# Patient Record
Sex: Female | Born: 1995 | Hispanic: Yes | Marital: Single | Smoking: Never smoker
Health system: Southern US, Community
[De-identification: ages and names within clinical notes are randomized; demographics above are authoritative.]

## PROBLEM LIST (undated history)

## (undated) DIAGNOSIS — O24419 Gestational diabetes mellitus in pregnancy, unspecified control: Secondary | ICD-10-CM

## (undated) DIAGNOSIS — Z789 Other specified health status: Secondary | ICD-10-CM

## (undated) HISTORY — PX: WISDOM TOOTH EXTRACTION: SHX21

---

## 2014-08-01 DIAGNOSIS — L301 Dyshidrosis [pompholyx]: Secondary | ICD-10-CM | POA: Insufficient documentation

## 2014-08-01 DIAGNOSIS — F419 Anxiety disorder, unspecified: Secondary | ICD-10-CM | POA: Insufficient documentation

## 2019-07-29 ENCOUNTER — Other Ambulatory Visit: Payer: Self-pay

## 2019-07-29 ENCOUNTER — Inpatient Hospital Stay (HOSPITAL_COMMUNITY): Payer: BLUE CROSS/BLUE SHIELD

## 2019-07-29 ENCOUNTER — Inpatient Hospital Stay (HOSPITAL_COMMUNITY)
Admission: AD | Admit: 2019-07-29 | Discharge: 2019-07-29 | Disposition: A | Discharge status: Home / Self Care | Payer: BLUE CROSS/BLUE SHIELD | Attending: Obstetrics and Gynecology | Admitting: Obstetrics and Gynecology

## 2019-07-29 ENCOUNTER — Encounter (HOSPITAL_COMMUNITY): Payer: Self-pay | Admitting: *Deleted

## 2019-07-29 DIAGNOSIS — Z3A01 Less than 8 weeks gestation of pregnancy: Secondary | ICD-10-CM | POA: Diagnosis not present

## 2019-07-29 DIAGNOSIS — O3680X0 Pregnancy with inconclusive fetal viability, not applicable or unspecified: Secondary | ICD-10-CM

## 2019-07-29 DIAGNOSIS — O209 Hemorrhage in early pregnancy, unspecified: Secondary | ICD-10-CM | POA: Diagnosis present

## 2019-07-29 DIAGNOSIS — O021 Missed abortion: Secondary | ICD-10-CM | POA: Insufficient documentation

## 2019-07-29 HISTORY — DX: Other specified health status: Z78.9

## 2019-07-29 LAB — CBC
HCT: 37.1 % (ref 36.0–46.0)
Hemoglobin: 11.7 g/dL — ABNORMAL LOW (ref 12.0–15.0)
MCH: 28.7 pg (ref 26.0–34.0)
MCHC: 31.5 g/dL (ref 30.0–36.0)
MCV: 91.2 fL (ref 80.0–100.0)
Platelets: 320 10*3/uL (ref 150–400)
RBC: 4.07 MIL/uL (ref 3.87–5.11)
RDW: 13.2 % (ref 11.5–15.5)
WBC: 6.6 10*3/uL (ref 4.0–10.5)
nRBC: 0 % (ref 0.0–0.2)

## 2019-07-29 LAB — URINALYSIS, ROUTINE W REFLEX MICROSCOPIC
Bilirubin Urine: NEGATIVE
Glucose, UA: NEGATIVE mg/dL
Ketones, ur: NEGATIVE mg/dL
Leukocytes,Ua: NEGATIVE
Nitrite: NEGATIVE
Protein, ur: NEGATIVE mg/dL
Specific Gravity, Urine: 1.012 (ref 1.005–1.030)
pH: 7 (ref 5.0–8.0)

## 2019-07-29 LAB — WET PREP, GENITAL
Clue Cells Wet Prep HPF POC: NONE SEEN
Trich, Wet Prep: NONE SEEN
Yeast Wet Prep HPF POC: NONE SEEN

## 2019-07-29 LAB — HCG, QUANTITATIVE, PREGNANCY: hCG, Beta Chain, Quant, S: 1216 m[IU]/mL — ABNORMAL HIGH (ref <5)

## 2019-07-29 NOTE — Discharge Instructions (Signed)
Vaginal Bleeding During Pregnancy, First Trimester  A small amount of bleeding from the vagina (spotting) is relatively common during early pregnancy. It usually stops on its own. Various things may cause bleeding or spotting during early pregnancy. Some bleeding may be related to the pregnancy, and some may not. In many cases, the bleeding is normal and is not a problem. However, bleeding can also be a sign of something serious. Be sure to tell your health care provider about any vaginal bleeding right away. Some possible causes of vaginal bleeding during the first trimester include:  Infection or inflammation of the cervix.  Growths (polyps) on the cervix.  Miscarriage or threatened miscarriage.  Pregnancy tissue developing outside of the uterus (ectopic pregnancy).  A mass of tissue developing in the uterus due to an egg being fertilized incorrectly (molar pregnancy). Follow these instructions at home: Activity  Follow instructions from your health care provider about limiting your activity. Ask what activities are safe for you.  If needed, make plans for someone to help with your regular activities.  Do not have sex or orgasms until your health care provider says that this is safe. General instructions  Take over-the-counter and prescription medicines only as told by your health care provider.  Pay attention to any changes in your symptoms.  Do not use tampons or douche.  Write down how many pads you use each day, how often you change pads, and how soaked (saturated) they are.  If you pass any tissue from your vagina, save the tissue so you can show it to your health care provider.  Keep all follow-up visits as told by your health care provider. This is important. Contact a health care provider if:  You have vaginal bleeding during any part of your pregnancy.  You have cramps or labor pains.  You have a fever. Get help right away if:  You have severe cramps in your  back or abdomen.  You pass large clots or a large amount of tissue from your vagina.  Your bleeding increases.  You feel light-headed or weak, or you faint.  You have chills.  You are leaking fluid or have a gush of fluid from your vagina. Summary  A small amount of bleeding (spotting) from the vagina is relatively common during early pregnancy.  Various things may cause bleeding or spotting in early pregnancy.  Be sure to tell your health care provider about any vaginal bleeding right away. This information is not intended to replace advice given to you by your health care provider. Make sure you discuss any questions you have with your health care provider. Document Released: 09/11/2005 Document Revised: 03/23/2019 Document Reviewed: 03/06/2017 Elsevier Patient Education  2020 [place] for [employer] at [facility]       Phone: [phone]  [place] for [employer] at [place]   Phone: [place] for [employer] at [place]  Phone: [place] for [employer] at [facility]  Phone: [place] for [place] at [place]  Phone: [place] for [place] at [facility]   Phone: [facility]       Phone: [phone]  [facility]    Phone: [facility]    Phone: [phone]  [facility]    Phone: [phone]  [place]    Phone: [phone]  [facility]       Phone: [phone]   [place] [facility]-Maternity  Phone: [place]    Phone: [phone]  [facility]   Phone: [phone]  [facility]      Phone: [phone]  [facility]    Phone:  [phone]

## 2019-07-29 NOTE — MAU Note (Signed)
Yesterday, when she went to the bathroom, there was pink blood on the tissue.  Was still happening this morning.   Had sex with her boyfriend, afterwards noted a little bit more blood, still very light.  About a wk ago, had 2 +HPT.  No pain

## 2019-07-29 NOTE — MAU Provider Note (Signed)
Chief Complaint: Vaginal Bleeding and Possible Pregnancy   First Provider Initiated Contact with Patient 07/29/19 2005        SUBJECTIVE HPI: Christina [name] is a 23 y.o. G1P0 at 6454w2d by LMP who presents to maternity admissions reporting bleeding since yesterday    Started out pink yesterday and got more red today.   No cramping. She denies vaginal itching/burning, urinary symptoms, h/a, dizziness, n/v, or fever/chills.  Is sure about dates by LMP  RN note: Yesterday, when she went to the bathroom, there was pink blood on the tissue.  Was still happening this morning.   Had sex with her boyfriend, afterwards noted a little bit more blood, still very light.  About a wk ago, had 2 +HPT.  No pain    Past Medical History:  Diagnosis Date  . Medical history non-contributory    Past Surgical History:  Procedure Laterality Date  . WISDOM TOOTH EXTRACTION     Social History   Socioeconomic History  . Marital status: Single    Spouse name: Not on file  . Number of children: Not on file  . Years of education: Not on file  . Highest education level: Not on file  Occupational History  . Not on file  Social Needs  . Financial resource strain: Not on file  . Food insecurity    Worry: Not on file    Inability: Not on file  . Transportation needs    Medical: Not on file    Non-medical: Not on file  Tobacco Use  . Smoking status: Never Smoker  . Smokeless tobacco: Never Used  Substance and Sexual Activity  . Alcohol use: Not Currently  . Drug use: Not Currently    Types: Marijuana  . Sexual activity: Yes  Lifestyle  . Physical activity    Days per week: Not on file    Minutes per session: Not on file  . Stress: Not on file  Relationships  . Social Musicianconnections    Talks on phone: Not on file    Gets together: Not on file    Attends religious service: Not on file    Active member of club or organization: Not on file    Attends meetings of clubs or organizations: Not on file     Relationship status: Not on file  . Intimate partner violence    Fear of current or ex partner: Not on file    Emotionally abused: Not on file    Physically abused: Not on file    Forced sexual activity: Not on file  Other Topics Concern  . Not on file  Social History Narrative  . Not on file   No current facility-administered medications on file prior to encounter.    Current Outpatient Medications on File Prior to Encounter  Medication Sig Dispense Refill  . Prenatal Vit-Fe Fumarate-FA (MULTIVITAMIN-PRENATAL) 27-0.8 MG TABS tablet Take 1 tablet by mouth daily at 12 noon.     Allergies  Allergen Reactions  . Pollen Extract   . Latex Rash    I have reviewed patient's Past Medical Hx, Surgical Hx, Family Hx, Social Hx, medications and allergies.   ROS:  Review of Systems  Constitutional: Negative for chills and fever.  Gastrointestinal: Negative for abdominal pain, constipation, diarrhea, nausea and vomiting.  Genitourinary: Positive for vaginal bleeding. Negative for pelvic pain.  Musculoskeletal: Negative for back pain.   Review of Systems  Other systems negative   Physical Exam  Physical Exam Patient Vitals  for the past 24 hrs:  BP Temp Temp src Pulse Resp SpO2 Height Weight  07/29/19 1854 136/75 98.9 F (37.2 C) Oral 94 18 100 % 5\' 2"  (1.575 m) 79.7 kg   Constitutional: Well-developed, well-nourished female in no acute distress.  Cardiovascular: normal rate Respiratory: normal effort GI: Abd soft, non-tender. Pos BS x 4 MS: Extremities nontender, no edema, normal ROM Neurologic: Alert and oriented x 4.  GU: Neg CVAT.  PELVIC EXAM: Cervix pink, visually closed, without lesion, small bloody discharge, vaginal walls and external genitalia normal Bimanual exam: Cervix 0/long/high, firm, anterior, neg CMT, uterus nontender, nonenlarged, adnexa without tenderness, enlargement, or mass   LAB RESULTS Results for orders placed or performed during the hospital  encounter of 07/29/19 (from the past 24 hour(s))  Urinalysis, Routine w reflex microscopic     Status: Abnormal   Collection Time: 07/29/19  7:34 PM  Result Value Ref Range   Color, Urine YELLOW YELLOW   APPearance CLEAR CLEAR   Specific Gravity, Urine 1.012 1.005 - 1.030   pH 7.0 5.0 - 8.0   Glucose, UA NEGATIVE NEGATIVE mg/dL   Hgb urine dipstick LARGE (A) NEGATIVE   Bilirubin Urine NEGATIVE NEGATIVE   Ketones, ur NEGATIVE NEGATIVE mg/dL   Protein, ur NEGATIVE NEGATIVE mg/dL   Nitrite NEGATIVE NEGATIVE   Leukocytes,Ua NEGATIVE NEGATIVE   RBC / HPF 0-5 0 - 5 RBC/hpf   WBC, UA 0-5 0 - 5 WBC/hpf   Bacteria, UA RARE (A) NONE SEEN   Squamous Epithelial / LPF 0-5 0 - 5   Mucus PRESENT    Sperm, UA PRESENT   Wet prep, genital     Status: Abnormal   Collection Time: 07/29/19  8:20 PM   Specimen: Cervical/Vaginal swab  Result Value Ref Range   Yeast Wet Prep HPF POC NONE SEEN NONE SEEN   Trich, Wet Prep NONE SEEN NONE SEEN   Clue Cells Wet Prep HPF POC NONE SEEN NONE SEEN   WBC, Wet Prep HPF POC FEW (A) NONE SEEN   Sperm PRESENT   CBC     Status: Abnormal   Collection Time: 07/29/19  8:24 PM  Result Value Ref Range   WBC 6.6 4.0 - 10.5 K/uL   RBC 4.07 3.87 - 5.11 MIL/uL   Hemoglobin 11.7 (L) 12.0 - 15.0 g/dL   HCT 16.137.1 09.636.0 - 04.546.0 %   MCV 91.2 80.0 - 100.0 fL   MCH 28.7 26.0 - 34.0 pg   MCHC 31.5 30.0 - 36.0 g/dL   RDW 40.913.2 81.111.5 - 91.415.5 %   Platelets 320 150 - 400 K/uL   nRBC 0.0 0.0 - 0.2 %  hCG, quantitative, pregnancy     Status: Abnormal   Collection Time: 07/29/19  8:24 PM  Result Value Ref Range   hCG, Beta Chain, Quant, S 1,216 (H) <5 mIU/mL   Blood type  O+  IMAGING [place] Ob Comp Less 14 Wks  Result Date: 07/29/2019 CLINICAL DATA:  Spotting, positive home pregnancy test EXAM: OBSTETRIC <14 WK US AND TRANSVAGINAL OB US TECHNIQUE: Both transabdominal and transvaginal ultrasound examinations were performed for complete evaluation of the gestation as well as the maternal  uterus, adnexal regions, and pelvic cul-de-sac. Transvaginal technique was performed to assess early pregnancy. COMPARISON:  None. FINDINGS: Intrauterine gestational sac: Single Yolk sac:  Visualized. Embryo:  Visualized. Cardiac Activity: Not Visualized. CRL: 2 mm   5 w   5 d  [place] EDC: 03/25/2020 Subchorionic hemorrhage:  None visualized. Maternal uterus/adnexae: Ovaries are within normal limits. Left ovary measures 2.6 x 2.3 by 2.5 cm. Right ovary measures 3.4 x 1.7 by 2.2 cm. No significant free fluid. IMPRESSION: Single IUP with visible embryo but no cardiac activity identified. Findings are suspicious but not yet definitive for failed pregnancy. Recommend follow-up US in 10-14 days for definitive diagnosis. This recommendation follows SRU consensus guidelines: Diagnostic Criteria for Nonviable Pregnancy Early in the First Trimester. [place] [employer] Med 2013; 829:5621-30. Electronically Signed   By: Donavan Foil M.D.   On: 07/29/2019 21:19   US Ob Transvaginal  Result Date: 07/29/2019 CLINICAL DATA:  Spotting, positive home pregnancy test EXAM: OBSTETRIC <14 WK [place] AND TRANSVAGINAL OB US TECHNIQUE: Both transabdominal and transvaginal ultrasound examinations were performed for complete evaluation of the gestation as well as the maternal uterus, adnexal regions, and pelvic cul-de-sac. Transvaginal technique was performed to assess early pregnancy. COMPARISON:  None. FINDINGS: Intrauterine gestational sac: Single Yolk sac:  Visualized. Embryo:  Visualized. Cardiac Activity: Not Visualized. CRL: 2 mm   5 w   5 d                  [place] EDC: 03/25/2020 Subchorionic hemorrhage:  None visualized. Maternal uterus/adnexae: Ovaries are within normal limits. Left ovary measures 2.6 x 2.3 by 2.5 cm. Right ovary measures 3.4 x 1.7 by 2.2 cm. No significant free fluid. IMPRESSION: Single IUP with visible embryo but no cardiac activity identified. Findings are suspicious but not yet definitive for failed pregnancy.  Recommend follow-up US in 10-14 days for definitive diagnosis. This recommendation follows SRU consensus guidelines: Diagnostic Criteria for Nonviable Pregnancy Early in the First Trimester. [place] Med 2013; 865:7846-96. Electronically Signed   By: Donavan Foil M.D.   On: 07/29/2019 21:19    MAU Management/MDM: Ordered usual first trimester r/o ectopic labs.   Pelvic exam and cultures done Will check baseline Ultrasound to rule out ectopic.  This bleeding/pain can represent a normal pregnancy with bleeding, spontaneous abortion or even an ectopic which can be life-threatening.  The process as listed above helps to determine which of these is present.  Reviewed findings with patient.  Ectopic is ruled out.  We cannot ascertain viability for certain, though no cardiac activity is worrisome. Discussed it is still possible to see a HR next week.  Bleeding precautions reviewed  ASSESSMENT Single intrauterine pregnancy at [redacted]w[redacted]d by LMP, [redacted]w[redacted]d by CRL No cardiac activity Suspicious but now definitive fetal demise   PLAN Discharge home Repeat US in 1 week with followup in office SAB precautions  Pt stable at time of discharge. Encouraged to return here or to other Urgent Care/ED if she develops worsening of symptoms, increase in pain, fever, or other concerning symptoms.    Hansel Feinstein [name], MSN Certified Nurse-Midwife 07/29/2019  8:06 PM

## 2019-07-30 LAB — HIV ANTIBODY (ROUTINE TESTING W REFLEX): HIV Screen 4th Generation wRfx: NONREACTIVE

## 2019-07-31 LAB — GC/CHLAMYDIA PROBE AMP (~~LOC~~) NOT AT ARMC
Chlamydia: NEGATIVE
Neisseria Gonorrhea: NEGATIVE

## 2019-08-02 ENCOUNTER — Other Ambulatory Visit: Payer: Self-pay | Admitting: *Deleted

## 2019-08-02 DIAGNOSIS — O3680X0 Pregnancy with inconclusive fetal viability, not applicable or unspecified: Secondary | ICD-10-CM

## 2019-08-02 NOTE — Progress Notes (Signed)
Pt called office to schedule follow up US and spoke with Kara Dies. Order for [place] was not found however per chart review by me, a note from Hansel Feinstein, [name] during MAU visit on 8/13 states that pt needs follow up US one week after prior scan. She will be scheduled for [place] and will receive results in our office on the same day.

## 2019-08-03 ENCOUNTER — Ambulatory Visit (HOSPITAL_COMMUNITY): Payer: BLUE CROSS/BLUE SHIELD

## 2019-08-10 ENCOUNTER — Telehealth: Payer: Self-pay | Admitting: Family Medicine

## 2019-08-10 NOTE — Telephone Encounter (Signed)
Spoke to patient about her appointment on 8/26 @ 10:40. Patient instructed to wear a face mask for the entire appointment and no visitors are allowed with her during the visit. Patient screened for covid symptoms and denied having any

## 2019-08-11 ENCOUNTER — Other Ambulatory Visit: Payer: Self-pay

## 2019-08-11 ENCOUNTER — Ambulatory Visit (HOSPITAL_COMMUNITY)
Admission: RE | Admit: 2019-08-11 | Discharge: 2019-08-11 | Disposition: A | Discharge status: Home / Self Care | Payer: BLUE CROSS/BLUE SHIELD | Source: Ambulatory Visit | Attending: Advanced Practice Midwife | Admitting: Advanced Practice Midwife

## 2019-08-11 ENCOUNTER — Ambulatory Visit (INDEPENDENT_AMBULATORY_CARE_PROVIDER_SITE_OTHER): Payer: BLUE CROSS/BLUE SHIELD | Admitting: Lactation Services

## 2019-08-11 DIAGNOSIS — O3680X0 Pregnancy with inconclusive fetal viability, not applicable or unspecified: Secondary | ICD-10-CM | POA: Insufficient documentation

## 2019-08-11 DIAGNOSIS — O039 Complete or unspecified spontaneous abortion without complication: Secondary | ICD-10-CM

## 2019-08-11 NOTE — Progress Notes (Signed)
Called [facility] at 11:39 and was informed that Dr. Thornton Papas was reading [place] at this time.   Called back  @11 :81 and was informed that [place] and was informed images did not transmit over to be read. Dr. Thornton Papas is in dictation now.   [place] results received and was reviewed with Kerry Hough, PA. Ordered non stat Hcg with follow up in 1 week.   Pt reports she went to MAU and thought every thing was ok. She has bleeding 2 weeks ago and lasted 12 days and has stopped now.   Pt was informed that she has had a miscarriage. Pt crying with results as expected. Questions answered.

## 2019-08-12 ENCOUNTER — Telehealth: Payer: Self-pay | Admitting: *Deleted

## 2019-08-12 ENCOUNTER — Telehealth: Payer: Self-pay | Admitting: Family Medicine

## 2019-08-12 LAB — BETA HCG QUANT (REF LAB): hCG Quant: 589 m[IU]/mL

## 2019-08-12 NOTE — Telephone Encounter (Signed)
I called Christina [name] and reviewed her last bhcg and explained provider recommended follow up bhcg ( lab draw) in one week and follow up with provider in 2 weeks. I explained registrar will call her with these appointments. I also explained she will have weekly bhcg until her levels return to less than 5. She also asked if she resume trying to get pregnant. I informed her we advise no intercourse or must use condoms until her levels return to normal so that we know all products of conception have been released.We discussed there is a risk of complications.  We discussed this time may vary but usually few weeks. She voices understanding.  Rudy Luhmann,RN

## 2019-08-12 NOTE — Telephone Encounter (Signed)
-----   Message from Luvenia Redden, PA-C sent at 08/12/2019  8:49 AM EDT ----- Patient will need repeat hCG (not stat) in 1 week and follow-up with any provider in 2 weeks.   Luvenia Redden, PA-C 08/12/2019 8:49 AM

## 2019-08-12 NOTE — Telephone Encounter (Signed)
Called and spoke to patient she is aware of her upcoming appointments.

## 2019-08-17 ENCOUNTER — Other Ambulatory Visit: Payer: Self-pay | Admitting: *Deleted

## 2019-08-17 DIAGNOSIS — O039 Complete or unspecified spontaneous abortion without complication: Secondary | ICD-10-CM

## 2019-08-18 ENCOUNTER — Telehealth: Payer: Self-pay | Admitting: Family Medicine

## 2019-08-18 NOTE — Telephone Encounter (Signed)
Spoke to patient about her appointment on 9/3 @ 9:30. Patient instructed to wear a face mask for the entire appointment and no visitors are allowed with her during the visit. Patient screened for covid symptoms and denied having any

## 2019-08-19 ENCOUNTER — Other Ambulatory Visit: Payer: Self-pay

## 2019-08-19 ENCOUNTER — Other Ambulatory Visit: Payer: BLUE CROSS/BLUE SHIELD

## 2019-08-19 DIAGNOSIS — O039 Complete or unspecified spontaneous abortion without complication: Secondary | ICD-10-CM

## 2019-08-20 ENCOUNTER — Telehealth: Payer: Self-pay | Admitting: *Deleted

## 2019-08-20 LAB — BETA HCG QUANT (REF LAB): hCG Quant: 174 m[IU]/mL

## 2019-08-20 NOTE — Telephone Encounter (Signed)
I called [place] and informed her of last bhcg level and results/ reccommendations per Kerry Hough , PA. She voices understanding. I explained I will have registar to call her with lab appt for bhcg for 08/26/19.  Linda,RN

## 2019-08-20 NOTE — Telephone Encounter (Signed)
-----   Message from Luvenia Redden, PA-C sent at 08/20/2019  9:06 AM EDT ----- Please inform patient that hCG continues to fall appropriately. It is not yet < 5, so we will continue to follow. The patient can keep her appointment as scheduled on 9/14 and should come in for next hCG on 9/10 (not stat).   Luvenia Redden, PA-C  08/20/2019 9:06 AM

## 2019-08-25 ENCOUNTER — Telehealth: Payer: Self-pay | Admitting: Obstetrics & Gynecology

## 2019-08-25 NOTE — Telephone Encounter (Signed)
Called the patient to inform of the upcoming appointment, informed of wearing a face mask, no children or visitors due to covid19 restrictions. The patient also answered no to the covid19 screening questions. °

## 2019-08-26 ENCOUNTER — Other Ambulatory Visit: Payer: Self-pay

## 2019-08-26 ENCOUNTER — Other Ambulatory Visit: Payer: BLUE CROSS/BLUE SHIELD

## 2019-08-26 DIAGNOSIS — O039 Complete or unspecified spontaneous abortion without complication: Secondary | ICD-10-CM

## 2019-08-27 ENCOUNTER — Telehealth: Payer: Self-pay | Admitting: *Deleted

## 2019-08-27 LAB — BETA HCG QUANT (REF LAB): hCG Quant: 6 m[IU]/mL

## 2019-08-27 NOTE — Telephone Encounter (Signed)
-----   Message from Luvenia Redden, PA-C sent at 08/27/2019 10:38 AM EDT ----- Patient's hCG is essentially normal at this point. She can keep appointment with Dr. Nehemiah Settle as scheduled for next week.   Luvenia Redden, PA-C 08/27/2019 10:38 AM

## 2019-08-27 NOTE — Telephone Encounter (Signed)
I called [place] and left a message I am calling with your lab result but since I have not reached you we can go over that with you Monday at your appointment which is at 2:55.  Linda,RN

## 2019-08-30 ENCOUNTER — Ambulatory Visit (INDEPENDENT_AMBULATORY_CARE_PROVIDER_SITE_OTHER): Payer: Self-pay | Admitting: Obstetrics and Gynecology

## 2019-08-30 ENCOUNTER — Encounter: Payer: Self-pay | Admitting: Obstetrics and Gynecology

## 2019-08-30 ENCOUNTER — Other Ambulatory Visit: Payer: Self-pay

## 2019-08-30 DIAGNOSIS — Z8759 Personal history of other complications of pregnancy, childbirth and the puerperium: Secondary | ICD-10-CM

## 2019-08-30 DIAGNOSIS — Z8742 Personal history of other diseases of the female genital tract: Secondary | ICD-10-CM

## 2019-08-30 DIAGNOSIS — O039 Complete or unspecified spontaneous abortion without complication: Secondary | ICD-10-CM

## 2019-08-30 NOTE — Progress Notes (Signed)
Christina [name] presents for f/u after first trimester SAB She has no complaints Bleeding has stopped Last BHCG 6 Declines contraception  PE AF VSS Lungs clear Heart RRR Abd soft + BS  A/P SAB Return to normal ADL's. F/U PRN

## 2019-08-31 ENCOUNTER — Encounter: Payer: Self-pay | Admitting: Obstetrics and Gynecology

## 2020-09-08 IMAGING — US OBSTETRIC <14 WK ULTRASOUND
1 series · 15 of 28 positions shown · non-contrast
Comparison: None.

CLINICAL DATA: Spotting, positive home pregnancy test

EXAM:
OBSTETRIC <14 WK US AND TRANSVAGINAL OB US
TECHNIQUE: Both transabdominal and transvaginal ultrasound examinations were
performed for complete evaluation of the gestation as well as the
maternal uterus, adnexal regions, and pelvic cul-de-sac.
Transvaginal technique was performed to assess early pregnancy.

[Series 1: obstetric <14 wk ultrasound · 60 acquisitions, 15 frames shown]
[im 1/60]
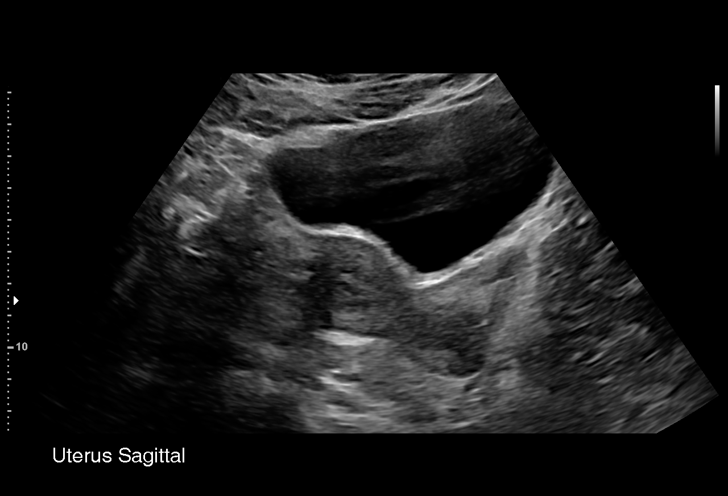
[im 5/60]
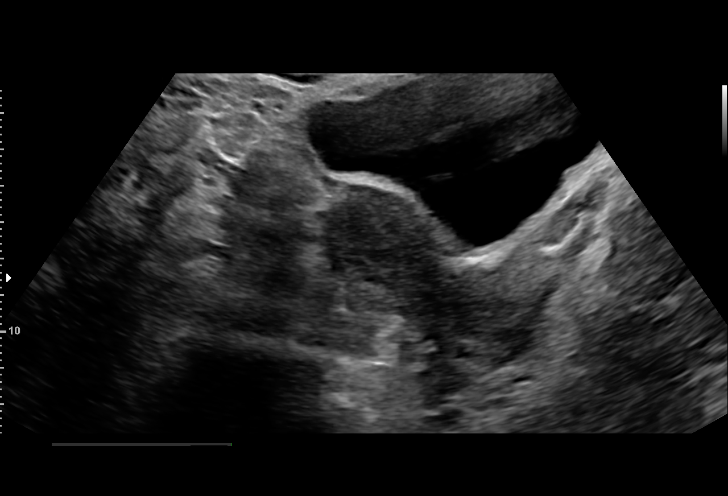
[im 9/60]
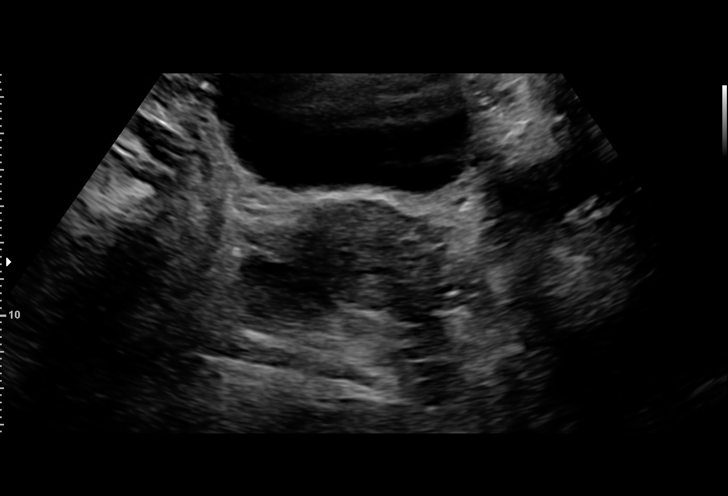
[im 14/60]
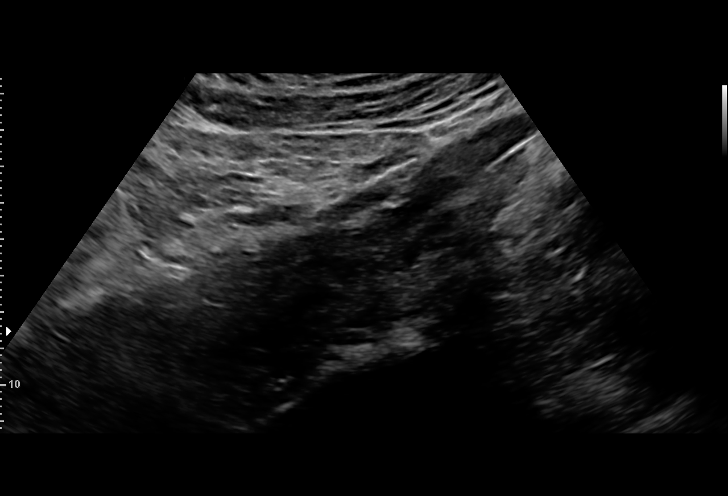
[im 18/60]
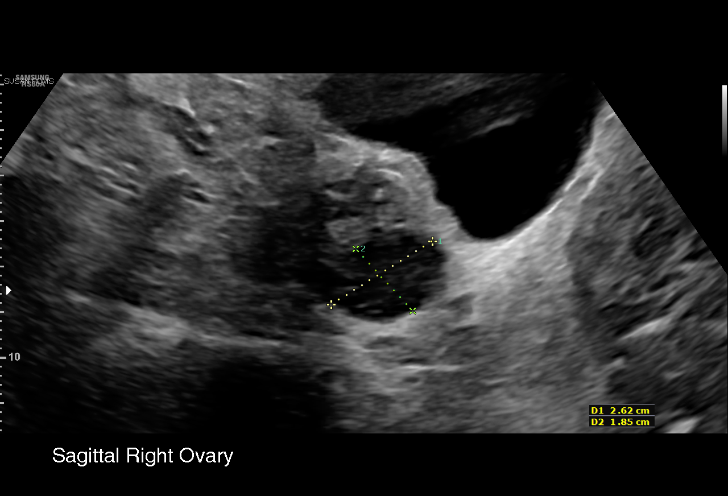
[im 22/60]
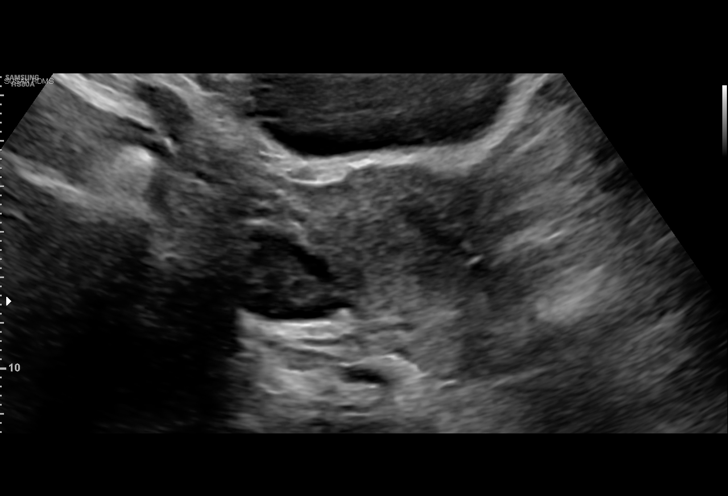
[im 27/60]
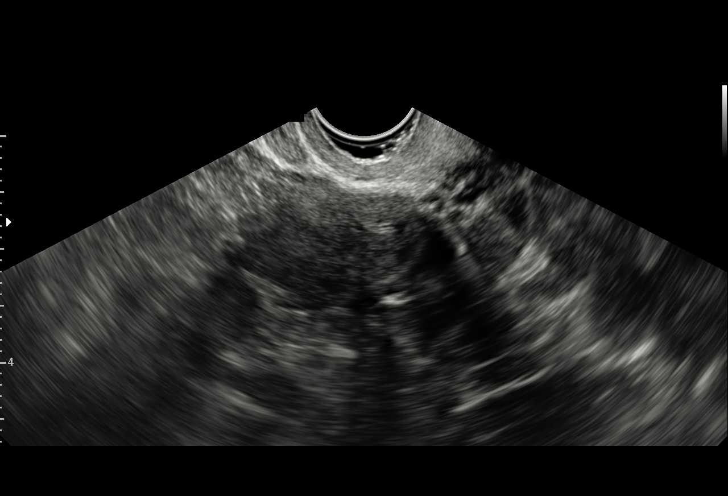
[im 31/60]
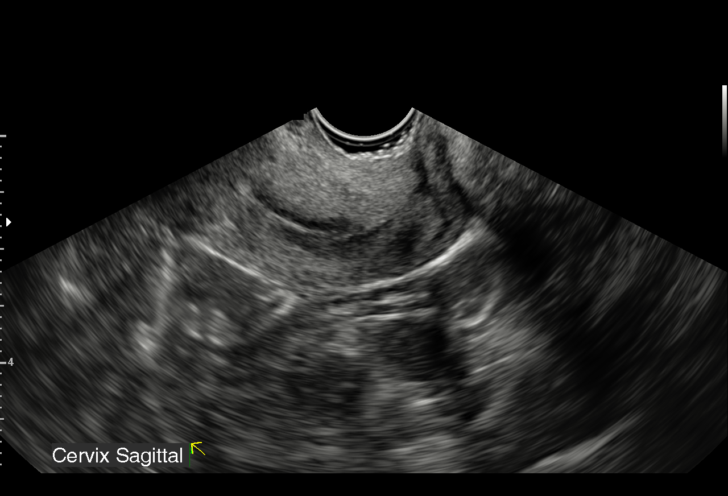
[im 33/60]
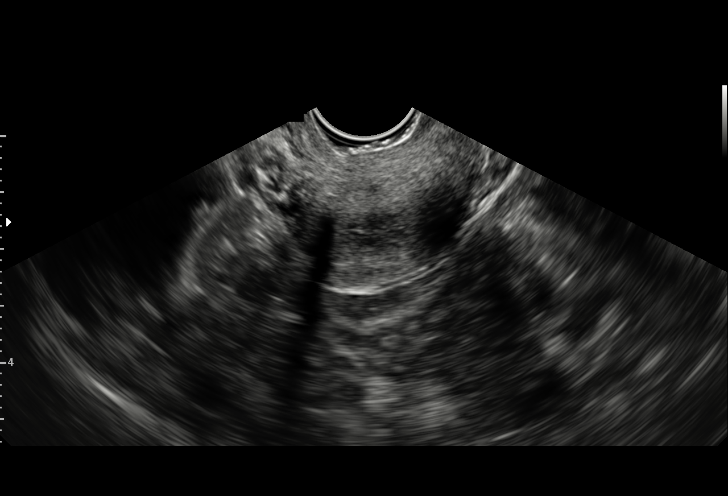
[im 38/60]
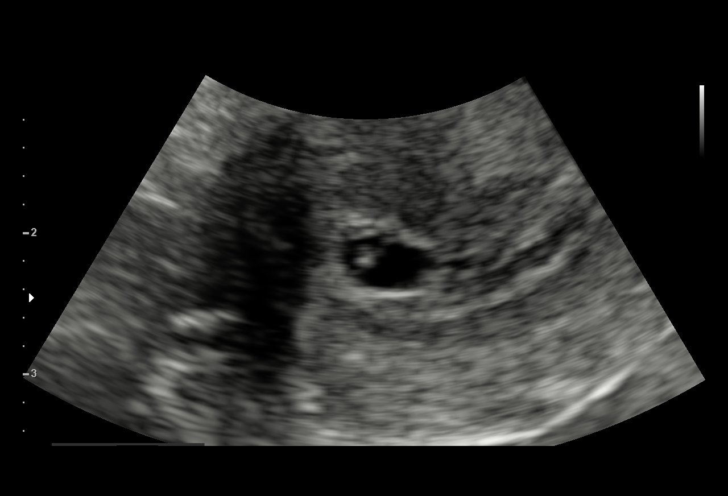
[im 42/60]
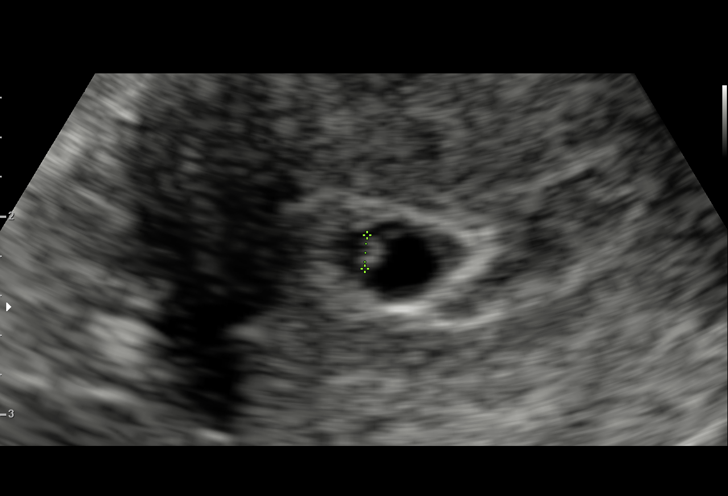
[im 46/60]
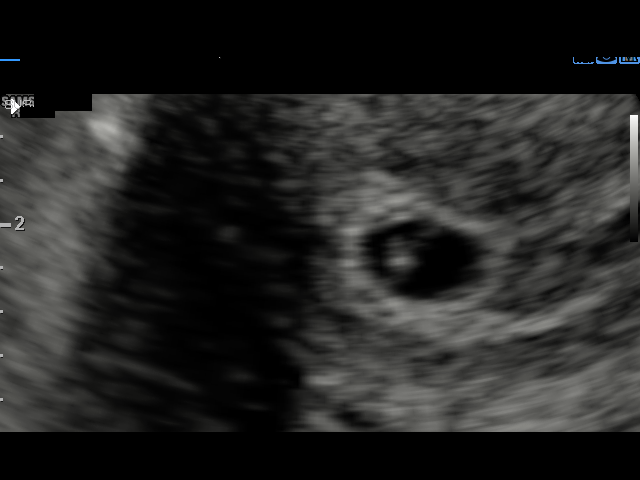
[im 51/60]
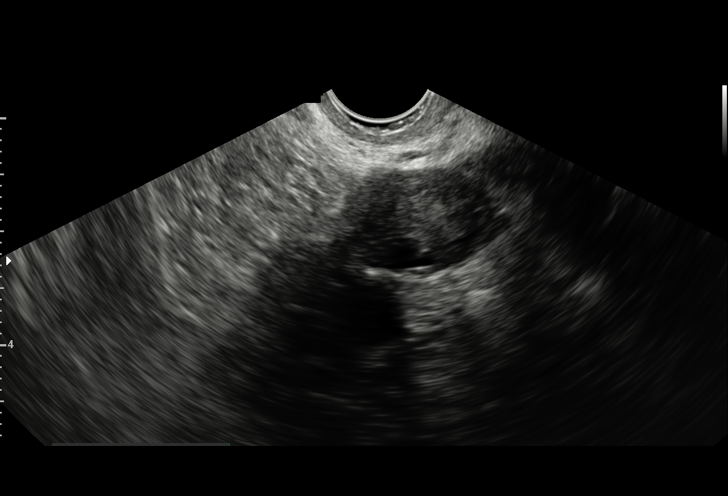
[im 55/60]
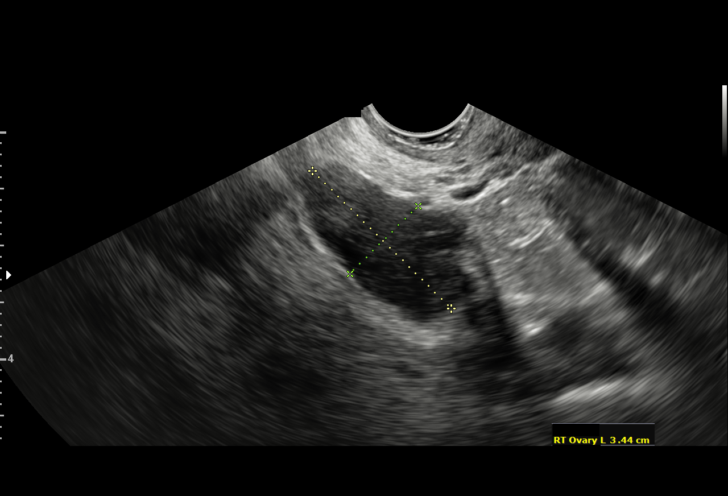
[im 60/60]
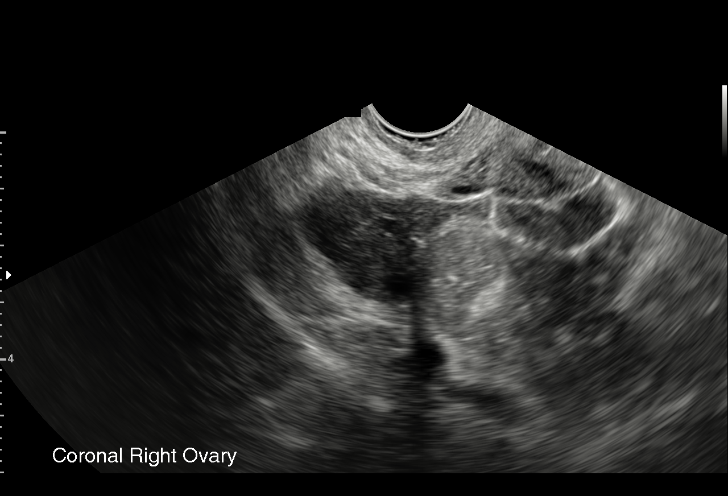

[15 of 28 positions shown; findings below may reference images not displayed]

FINDINGS: Intrauterine gestational sac: Single

Yolk sac:  Visualized.

Embryo:  Visualized.

Cardiac Activity: Not Visualized.

CRL: 2 mm   5 w   5 d                  US EDC: 03/25/2020

Subchorionic hemorrhage:  None visualized.

Maternal uterus/adnexae: Ovaries are within normal limits. Left
ovary measures 2.6 x 2.3 by 2.5 cm. Right ovary measures 3.4 x
by 2.2 cm. No significant free fluid.
IMPRESSION: Single IUP with visible embryo but no cardiac activity identified.
Findings are suspicious but not yet definitive for failed pregnancy.
Recommend follow-up US in 10-14 days for definitive diagnosis. This
recommendation follows SRU consensus guidelines: Diagnostic Criteria
for Nonviable Pregnancy Early in the First Trimester. N Engl J Med

## 2020-09-21 IMAGING — US TRANSVAGINAL OB ULTRASOUND
1 series · 15 of 28 positions shown · non-contrast
Comparison: 07/29/2019

CLINICAL DATA: Assessment of fetal viability, first trimester
pregnancy

EXAM:
TRANSVAGINAL OB ULTRASOUND
TECHNIQUE: Transvaginal ultrasound was performed for complete evaluation of the
gestation as well as the maternal uterus, adnexal regions, and
pelvic cul-de-sac.

[Series 1: transvaginal ob ultrasound · 15 of 32 slices shown]
[im 1/32]
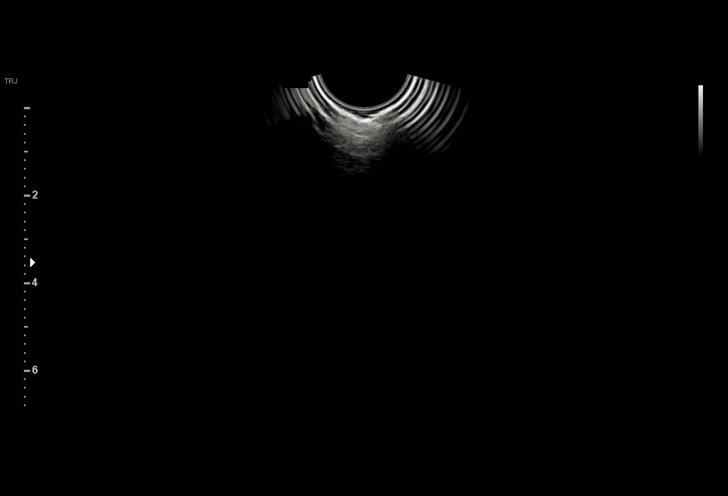
[im 3/32]
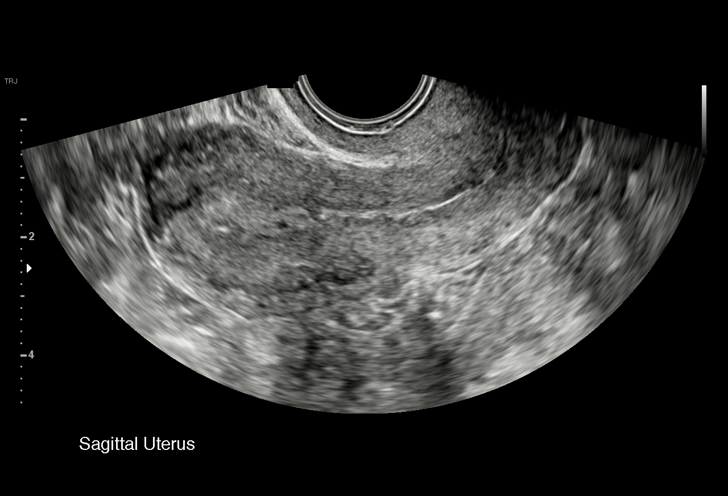
[im 5/32]
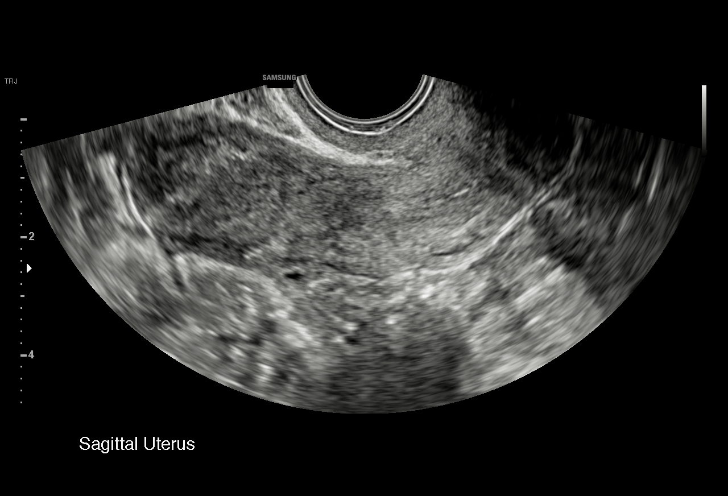
[im 7/32]
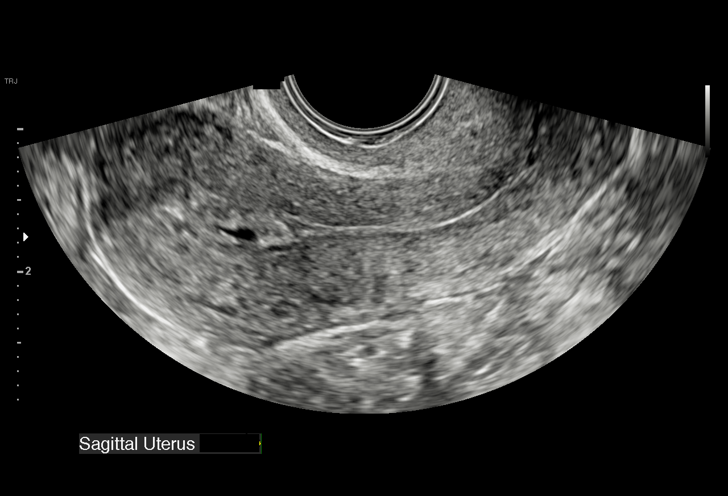
[im 10/32]
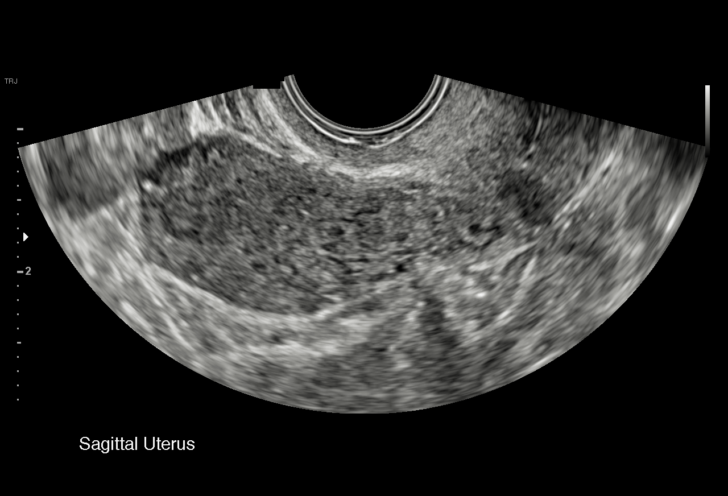
[im 12/32]
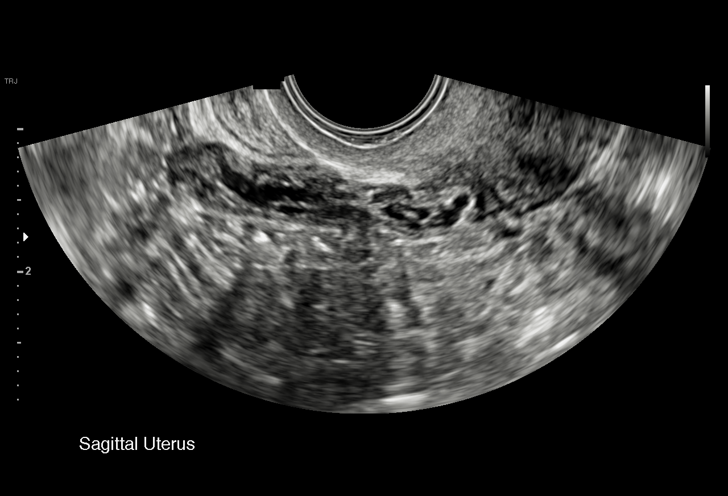
[im 14/32]
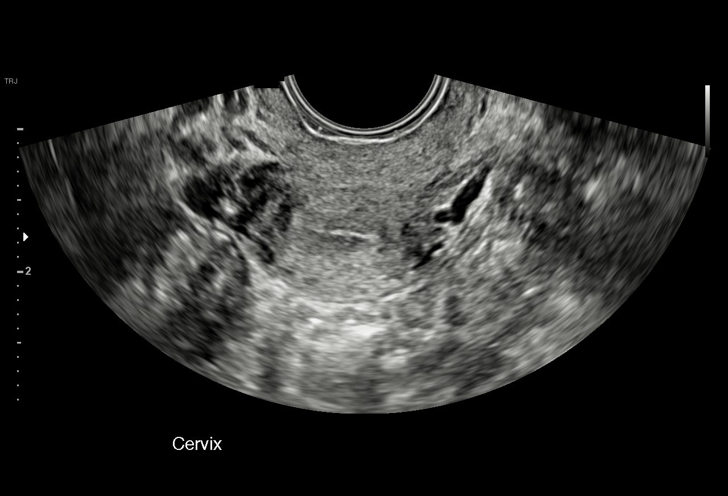
[im 17/32]
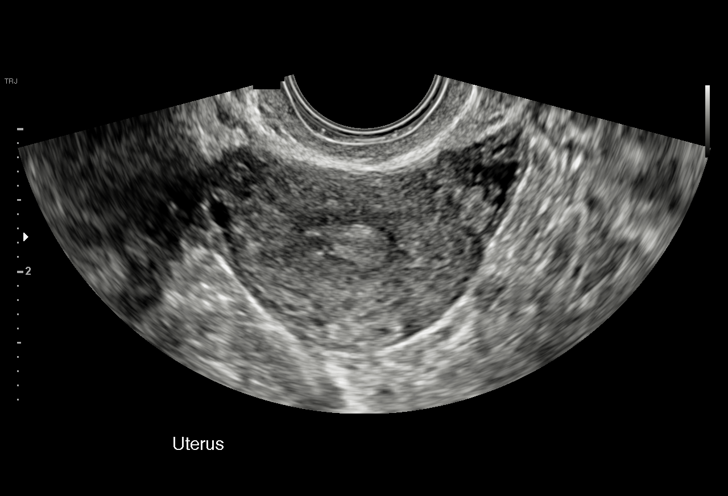
[im 18/32]
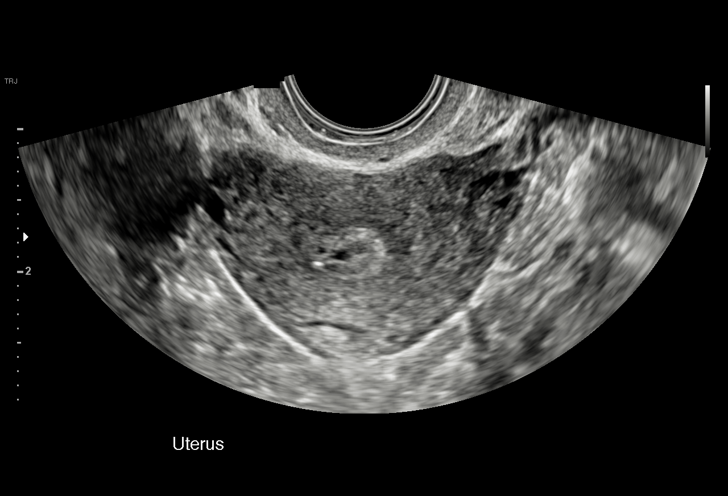
[im 20/32]
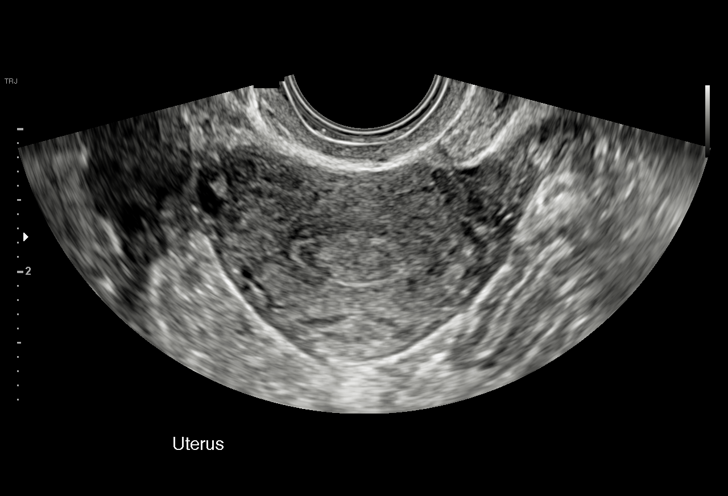
[im 22/32]
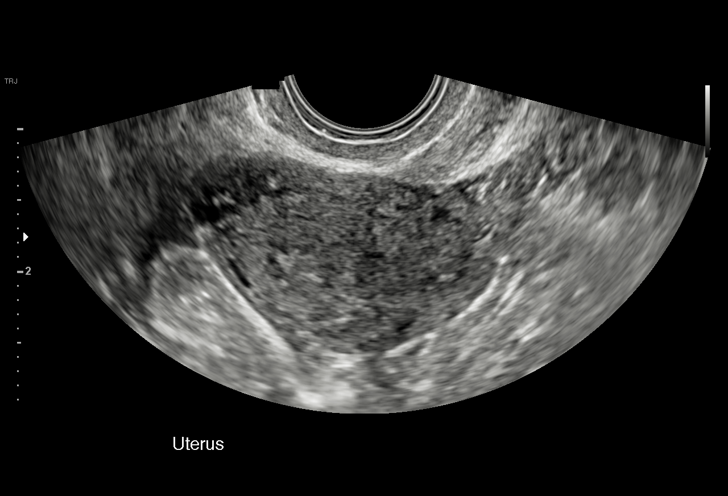
[im 25/32]
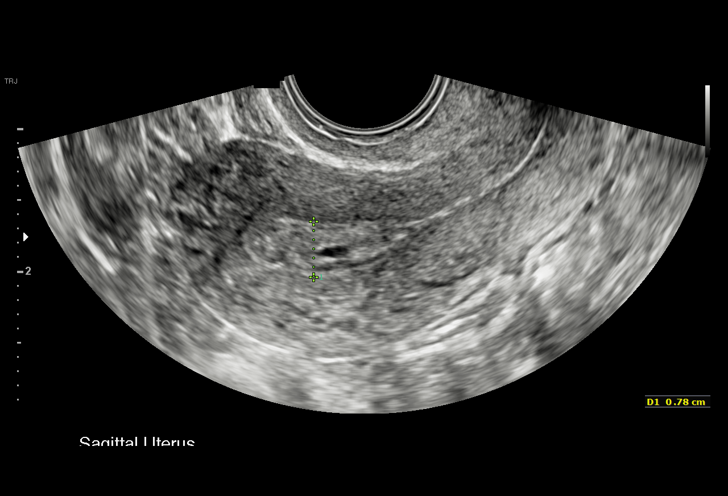
[im 27/32]
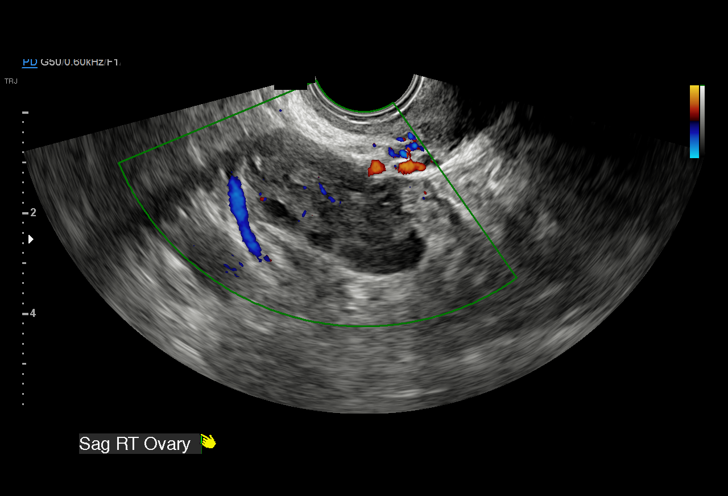
[im 29/32]
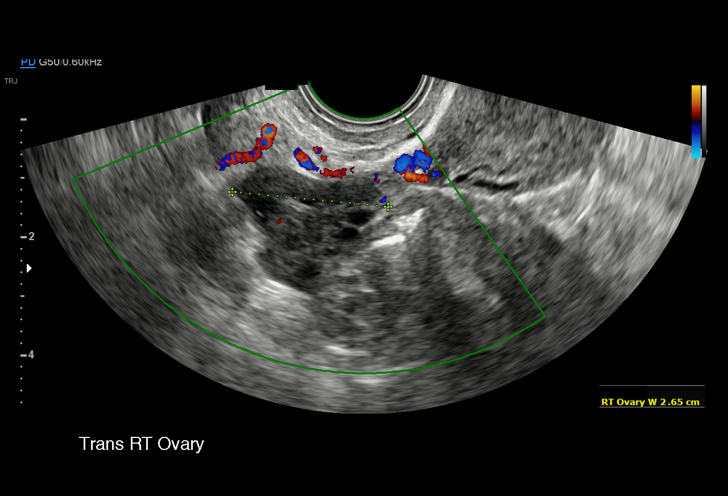
[im 32/32]
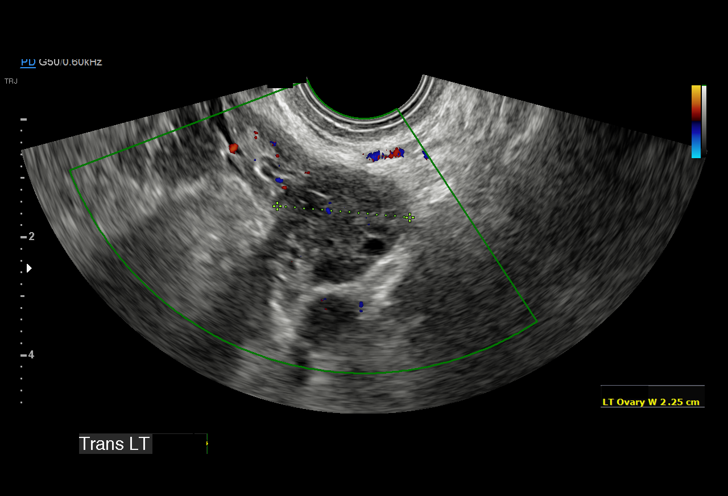

[15 of 28 positions shown; findings below may reference images not displayed]

FINDINGS: Intrauterine gestational sac: Previously identified gestational sac
no longer seen

Yolk sac:  N/A

Embryo:  N/A

Cardiac Activity: N/A

Heart Rate: N/A bpm

MSD:   mm    w     d

CRL:     mm    w  d                  US EDC:

Subchorionic hemorrhage:  None visualized.

Maternal uterus/adnexae:

Tiny endometrial fluid collection 2 x 4 x 2 mm, much smaller,
different appearance, and in a different position from the
gestational sac seen on the previous exam, favor minimal endometrial
fluid.

Endometrial complex 8 mm thick.

No additional uterine masses

RIGHT ovary normal size and morphology, 4.1 x 2.7 x 2.0 cm.

LEFT ovary normal size and morphology, 3.6 x 2.5 x 2.3 cm.

Trace free pelvic fluid.

No adnexal masses.
IMPRESSION: Previously identified gestational sac is no longer seen consistent
with spontaneous abortion.

8 mm thick endometrial complex with tiny endometrial fluid
collection 2 x 4 x 2 mm, at a different place, much smaller, and
different appearance than prior gestational sac question minimal
endometrial fluid.

Remainder of uterus and ovaries unremarkable.

## 2021-01-29 DIAGNOSIS — O26879 Cervical shortening, unspecified trimester: Secondary | ICD-10-CM | POA: Insufficient documentation

## 2021-01-29 DIAGNOSIS — N883 Incompetence of cervix uteri: Secondary | ICD-10-CM | POA: Insufficient documentation

## 2022-08-06 ENCOUNTER — Encounter: Payer: Self-pay | Admitting: General Practice

## 2022-08-06 ENCOUNTER — Inpatient Hospital Stay (HOSPITAL_COMMUNITY)
Admission: AD | Admit: 2022-08-06 | Discharge: 2022-08-06 | Disposition: A | Discharge status: Home / Self Care | Payer: Self-pay | Attending: Obstetrics and Gynecology | Admitting: Obstetrics and Gynecology

## 2022-08-06 ENCOUNTER — Ambulatory Visit (INDEPENDENT_AMBULATORY_CARE_PROVIDER_SITE_OTHER): Payer: Medicaid Other | Admitting: General Practice

## 2022-08-06 DIAGNOSIS — Z3200 Encounter for pregnancy test, result unknown: Secondary | ICD-10-CM | POA: Insufficient documentation

## 2022-08-06 DIAGNOSIS — Z3201 Encounter for pregnancy test, result positive: Secondary | ICD-10-CM

## 2022-08-06 LAB — POCT PREGNANCY, URINE: Preg Test, Ur: POSITIVE — AB

## 2022-08-06 NOTE — Progress Notes (Signed)
Patient came by office and dropped off urine sample for UPT. UPT +.  Called patient and informed her of + UPT. Patient reports first positive home test 2 weeks ago. LMP 06/23/22 EDD 03/30/23 [redacted]w[redacted]d. Allergies/meds reviewed with patient. Patient will contact [place] to initiate prenatal care.   Chase Caller RN BSN 08/06/22

## 2022-08-06 NOTE — MAU Provider Note (Signed)
Event Date/Time   First Provider Initiated Contact with Patient 08/06/22 1450      S Ms. Christina [name] is a 26 y.o. G3P1011 patient who presents to MAU today with no complaints. She desires a UPT for pregnancy confirmation. She desires to keep this pregnancy, with no intention of termination. She was referred by a family friend to come here. She denies vaginal bleeding, pelvic and vaginal pain, abnormal vaginal discharge or urinary complaints. She is experiencing some occasional nausea, but no vomiting. No other complaints.   O BP 131/78 (BP Location: Right Arm)   Pulse 88   Temp 98.4 F (36.9 C) (Oral)   Resp 16   Ht 5\' 2"  (1.575 m)   Wt 88.5 kg   LMP 06/23/2022   SpO2 100%   BMI 35.67 kg/m  Physical Exam Vitals and nursing note reviewed.  Constitutional:      General: She is not in acute distress.    Appearance: Normal appearance.  HENT:     Head: Normocephalic.  Pulmonary:     Effort: Pulmonary effort is normal.  Musculoskeletal:     Cervical back: Normal range of motion.  Skin:    General: Skin is warm and dry.  Neurological:     Mental Status: She is alert and oriented to person, place, and time.  Psychiatric:        Mood and Affect: Mood normal.     A Medical screening exam complete - Discussed with patient that the appropriate place for pregnancy testing is [facility] on [address]. Discussed the Emergency Department nature of the MAU and that standard confirmation of pregnancies is performed here. Patient verbalized understanding.    P Discharge from MAU in stable condition Provided patient the information on where to get pregnancy testing results.  List of options for follow-up given  Warning signs for worsening condition that would warrant emergency follow-up discussed Patient may return to MAU as needed   08/24/2022, Carlynn Herald 08/06/2022 5:09 PM

## 2022-08-06 NOTE — MAU Note (Signed)
Christina [name] is a 26 y.o. at Unknown here in MAU reporting: had a +preg test about 2 wks ago.  Was told she would need a preg verification.  No problems, no pain, no bleeding. Little nausea (no vomiting), a lot of fatigue.  LMP: 7/9 Onset of complaint: +HPT ~ 2 wks ago Pain score: none Vitals:   08/06/22 1443  BP: 131/78  Pulse: 88  Resp: 16  Temp: 98.4 F (36.9 C)  SpO2: 100%      Lab orders placed from triage:  none

## 2022-09-24 ENCOUNTER — Inpatient Hospital Stay (HOSPITAL_COMMUNITY)
Admission: AD | Admit: 2022-09-24 | Discharge: 2022-09-24 | Disposition: A | Discharge status: Home / Self Care | Payer: Medicaid Other | Attending: Obstetrics and Gynecology | Admitting: Obstetrics and Gynecology

## 2022-09-24 ENCOUNTER — Encounter (HOSPITAL_COMMUNITY): Payer: Self-pay | Admitting: Obstetrics and Gynecology

## 2022-09-24 DIAGNOSIS — O209 Hemorrhage in early pregnancy, unspecified: Secondary | ICD-10-CM | POA: Insufficient documentation

## 2022-09-24 DIAGNOSIS — Z3A13 13 weeks gestation of pregnancy: Secondary | ICD-10-CM | POA: Insufficient documentation

## 2022-09-24 DIAGNOSIS — O4441 Low lying placenta NOS or without hemorrhage, first trimester: Secondary | ICD-10-CM | POA: Diagnosis not present

## 2022-09-24 DIAGNOSIS — O4692 Antepartum hemorrhage, unspecified, second trimester: Secondary | ICD-10-CM | POA: Diagnosis not present

## 2022-09-24 DIAGNOSIS — O4440 Low lying placenta NOS or without hemorrhage, unspecified trimester: Secondary | ICD-10-CM

## 2022-09-24 LAB — URINALYSIS, ROUTINE W REFLEX MICROSCOPIC
Bilirubin Urine: NEGATIVE
Glucose, UA: NEGATIVE mg/dL
Ketones, ur: NEGATIVE mg/dL
Leukocytes,Ua: NEGATIVE
Nitrite: NEGATIVE
Protein, ur: 30 mg/dL — AB
Specific Gravity, Urine: 1.015 (ref 1.005–1.030)
pH: 6 (ref 5.0–8.0)

## 2022-09-24 LAB — HEPATITIS C ANTIBODY: HCV Ab: NEGATIVE

## 2022-09-24 LAB — OB RESULTS CONSOLE HEPATITIS B SURFACE ANTIGEN: Hepatitis B Surface Ag: NEGATIVE

## 2022-09-24 NOTE — MAU Note (Signed)
.  Christina [name] is a 26 y.o. at [redacted]w[redacted]d here in MAU reporting having sharp pain in R groin while at work. Went to [facility] and felt vag bleeding. Bled thru her pants. Went to doctor's office and was sent to ED. Went to [facility] ED and sat for 5hrs without being seen. Left there and came here. Gets care at [facility]. States has had an issue with bleeding. Was seen at office yesterday with spotting and ED last wk with vag bleeding. States placenta close to cerix on u/s yesterday.   Onset of complaint: today Pain score:2 Vitals:   09/24/22 2125 09/24/22 2128  BP:  137/77  Pulse: 79   Resp: 17   Temp: 98.3 F (36.8 C)   SpO2: 100%      FHT:n/a Lab orders placed from triage:  u/a

## 2022-09-24 NOTE — MAU Provider Note (Signed)
History     CSN: [id number]  Arrival date and time: 09/24/22 2107   Event Date/Time   First Provider Initiated Contact with Patient 09/24/22 2243      Chief Complaint  Patient presents with   Vaginal Bleeding   HPI  Christina [name] is a 26 y.o. G3P1011 at [redacted]w[redacted]d who presents for evaluation of vaginal bleeding. Patient reports she was at work and had a sharp pain in her groin that she describes as lightening crotch. After that, she bled through her clothes. She reports she called her OB who recommended she be seen. After sitting in the [facility] ER for 5 hours, she left and came to MAU. Labs were done in the ER which are stable. She reports the bleeding has slowed down since her arrival to MAU. She denies any pain at this time.   She reports she has a known low lying placenta with possible vessels across the cervical os.    She denies any discharge and leaking of fluid. Denies any constipation, diarrhea or any urinary complaints.   OB History     Gravida  3   Para  1   Term  1   Preterm  0   AB  1   Living  1      SAB  1   IAB  0   Ectopic  0   Multiple  0   Live Births  1           Past Medical History:  Diagnosis Date   Medical history non-contributory     Past Surgical History:  Procedure Laterality Date   CESAREAN SECTION     WISDOM TOOTH EXTRACTION      History reviewed. No pertinent family history.  Social History   Tobacco Use   Smoking status: Never   Smokeless tobacco: Never  Substance Use Topics   Alcohol use: Not Currently   Drug use: Not Currently    Types: Marijuana    Allergies:  Allergies  Allergen Reactions   Pollen Extract    Latex Rash    Medications Prior to Admission  Medication Sig Dispense Refill Last Dose   Ferrous Sulfate Dried (SLOW RELEASE IRON) 45 MG TBCR Take by mouth.   09/24/2022   ondansetron (ZOFRAN-ODT) 4 MG disintegrating tablet Take 4 mg by mouth every 8 (eight) hours as needed for nausea or  vomiting.   09/24/2022   Prenatal Vit-Fe Fumarate-FA (MULTIVITAMIN-PRENATAL) 27-0.8 MG TABS tablet Take 1 tablet by mouth daily at 12 noon.       Review of Systems  Constitutional: Negative.  Negative for fatigue and fever.  HENT: Negative.    Respiratory: Negative.  Negative for shortness of breath.   Cardiovascular: Negative.  Negative for chest pain.  Gastrointestinal: Negative.  Negative for abdominal pain, constipation, diarrhea, nausea and vomiting.  Genitourinary:  Positive for vaginal bleeding. Negative for dysuria.  Neurological: Negative.  Negative for dizziness and headaches.   Physical Exam   Blood pressure 137/77, pulse 79, temperature 98.3 F (36.8 C), resp. rate 17, height 5\' 2"  (1.575 m), weight 85.3 kg, last menstrual period 06/23/2022, SpO2 100 %, unknown if currently breastfeeding.  Patient Vitals for the past 24 hrs:  BP Temp Pulse Resp SpO2 Height Weight  09/24/22 2128 137/77 -- -- -- -- -- --  09/24/22 2125 -- 98.3 F (36.8 C) 79 17 100 % 5\' 2"  (1.575 m) 85.3 kg    Physical Exam Vitals and nursing note reviewed.  Constitutional:      General: She is not in acute distress.    Appearance: She is well-developed.  HENT:     Head: Normocephalic.  Eyes:     Pupils: Pupils are equal, round, and reactive to light.  Cardiovascular:     Rate and Rhythm: Normal rate and regular rhythm.     Heart sounds: Normal heart sounds.  Pulmonary:     Effort: Pulmonary effort is normal. No respiratory distress.     Breath sounds: Normal breath sounds.  Abdominal:     General: Bowel sounds are normal. There is no distension.     Palpations: Abdomen is soft.     Tenderness: There is no abdominal tenderness.  Genitourinary:    Comments: SSE: small amount of dark brown blood on vaginal walls Skin:    General: Skin is warm and dry.  Neurological:     Mental Status: She is alert and oriented to person, place, and time.  Psychiatric:        Mood and Affect: Mood normal.         Behavior: Behavior normal.        Thought Content: Thought content normal.        Judgment: Judgment normal.    MAU Course  Procedures  Results for orders placed or performed during the hospital encounter of 09/24/22 (from the past 24 hour(s))  Urinalysis, Routine w reflex microscopic Urine, Clean Catch     Status: Abnormal   Collection Time: 09/24/22  9:45 PM  Result Value Ref Range   Color, Urine YELLOW YELLOW   APPearance CLOUDY (A) CLEAR   Specific Gravity, Urine 1.015 1.005 - 1.030   pH 6.0 5.0 - 8.0   Glucose, UA NEGATIVE NEGATIVE mg/dL   Hgb urine dipstick LARGE (A) NEGATIVE   Bilirubin Urine NEGATIVE NEGATIVE   Ketones, ur NEGATIVE NEGATIVE mg/dL   Protein, ur 30 (A) NEGATIVE mg/dL   Nitrite NEGATIVE NEGATIVE   Leukocytes,Ua NEGATIVE NEGATIVE   RBC / HPF 11-20 0 - 5 RBC/hpf   WBC, UA 6-10 0 - 5 WBC/hpf   Bacteria, UA FEW (A) NONE SEEN   Squamous Epithelial / LPF 11-20 0 - 5   Mucus PRESENT     MDM Prenatal records from community office reviewed. Pregnancy complicated by low lying placenta, hx of c/s.  Ultrasound result from 09/23/22 in office:  "Transvaginal and limited transabdominal u/s at 13+wks, Anteverted Uterus,  single gestational sac, single viable IUP, CRL c/w dates, FHR = 161BPM,  posterior grade 0 placenta - very low lying placenta - approx. 0.34cm  superior to internal os - prominent vessels seen adjacent to internal os,  cervix long and closed, Bilateral adnexa WNL, trace free fluid in the  posterior cul de sac."  Labs ordered and reviewed.   UA Labs reviewed from [place] 12.1 O Pos blood type  No active bleeding on exam  Pt informed that the ultrasound is considered a limited OB ultrasound and is not intended to be a complete ultrasound exam.  Patient also informed that the ultrasound is not being completed with the intent of assessing for fetal or placental anomalies or any pelvic abnormalities.  Explained that the purpose of today's  ultrasound is to assess for  viability.  Patient acknowledges the purpose of the exam and the limitations of the study.    Live IUP with FHR 144 bpm and active fetal movement noted.   Lengthy discussion with patient of implications of low  lying placenta and when to be seen. Reviewed unpredictability of bleeding and frank discussion of non viable fetus at this time. Discussed MFM consult and hope that placenta moves as uterus grows. Patient states she plans to transfer care to [facility] going forward.  Assessment and Plan   1. Vaginal bleeding in pregnancy, second trimester   2. [redacted] weeks gestation of pregnancy   3. Low-lying placenta     -Discharge home in stable condition -Vaginal bleeding precautions discussed -Patient advised to follow-up with OB as scheduled for prenatal care -Patient may return to MAU as needed or if her condition were to change or worsen  Wende Mott, [name] 09/24/2022, 10:43 PM

## 2022-09-24 NOTE — Discharge Instructions (Signed)

## 2022-10-29 ENCOUNTER — Encounter: Payer: Self-pay | Admitting: General Practice

## 2022-12-16 NOTE — L&D Delivery Note (Signed)
   Delivery Note:   G3P1011 at [redacted]w[redacted]d  Admitting diagnosis: Indication for care in labor or delivery [O75.9] Risks: TOLAC  A1GDM  Anxiety   First Stage:  Induction of labor:N/A  Onset of labor: 0100 03/24/23 Augmentation: Pitocin that was discontinued.  ROM: SROM mec stained fluid @ 0100 Active labor onset: @ 0100 Analgesia /Anesthesia/Pain control intrapartum: Epidural   Second Stage:  Complete dilation at 03/24/2023  1145 Onset of pushing at 11:48 FHR second stage Cat II. Variables with contractions, with quick return to baseline. Moderate variability, accels present.    [name] called to patient bedside. Patient Pushing in lithotomy position with [name] and L&D staff support at bedside. Patient FOB and Doula present for delivery and supportive.    Delivery of a Live born female  Birth Weight:  PENDING  APGAR: 7, 9  Newborn Delivery   Birth date/time: 03/24/2023 13:41:00 Delivery type: Vaginal, Spontaneous     With great maternal pushing efforts, fetal head crowned to +4 station. NICU team called to bedside for thick mec stained fluid. Turtle sign observed, MD called to bedside for standby. NICU at bedside.  On the next contractions Fetal head delivered in cephalic presentation, position DOA 1 loop of umbilical cord expelled with head. On the next push fetal head  spontaneously restituted to LOT and remaining fetal body delivered with ease. Crying infant placed skin to skin. NICU dismissed.  Nuchal Cord: No    After 4 mins of life cord double clamped after cessation of pulsation, and cut by FOB.  Collection of cord blood for typing completed. Cord blood donation-None  Arterial cord blood sample-No    Third Stage:  With gentle cord traction and maternal pushing efforts Placenta delivered-Spontaneous  intact with 3 vessels . Uterine tone firm bleeding minimal  Uterotonics: IV Bolus Initiated  Placenta to patient for disposal.  1st degree  laceration identified.  Episiotomy: N/A   Local analgesia: N/A   Repair: 1st degree perineal repaired to hemostasis in the traditional fashion  Est. Blood Loss (mL):300.00   Complications: None   Mom to postpartum.  Baby boy "Elijah" to Couplet care / Skin to Skin.  Delivery Report:  Review the Delivery Report for details.    Ameerah Huffstetler Danella Deis) Suzie Portela, MSN, [name]  [facility]  03/24/23  2:34 PM

## 2022-12-24 ENCOUNTER — Encounter: Payer: Self-pay | Admitting: General Practice

## 2022-12-24 ENCOUNTER — Ambulatory Visit (INDEPENDENT_AMBULATORY_CARE_PROVIDER_SITE_OTHER): Payer: Medicaid Other | Admitting: Advanced Practice Midwife

## 2022-12-24 ENCOUNTER — Encounter: Payer: Self-pay | Admitting: Advanced Practice Midwife

## 2022-12-24 VITALS — BP 112/74 | HR 99 | Wt 197.0 lb

## 2022-12-24 DIAGNOSIS — Z3A26 26 weeks gestation of pregnancy: Secondary | ICD-10-CM | POA: Diagnosis not present

## 2022-12-24 DIAGNOSIS — O99342 Other mental disorders complicating pregnancy, second trimester: Secondary | ICD-10-CM | POA: Diagnosis not present

## 2022-12-24 DIAGNOSIS — Z8759 Personal history of other complications of pregnancy, childbirth and the puerperium: Secondary | ICD-10-CM

## 2022-12-24 DIAGNOSIS — Z98891 History of uterine scar from previous surgery: Secondary | ICD-10-CM

## 2022-12-24 DIAGNOSIS — N368 Other specified disorders of urethra: Secondary | ICD-10-CM

## 2022-12-24 DIAGNOSIS — O26879 Cervical shortening, unspecified trimester: Secondary | ICD-10-CM

## 2022-12-24 DIAGNOSIS — O4440 Low lying placenta NOS or without hemorrhage, unspecified trimester: Secondary | ICD-10-CM

## 2022-12-24 DIAGNOSIS — O4442 Low lying placenta NOS or without hemorrhage, second trimester: Secondary | ICD-10-CM

## 2022-12-24 DIAGNOSIS — O26872 Cervical shortening, second trimester: Secondary | ICD-10-CM | POA: Diagnosis not present

## 2022-12-24 DIAGNOSIS — F419 Anxiety disorder, unspecified: Secondary | ICD-10-CM

## 2022-12-24 DIAGNOSIS — Z3480 Encounter for supervision of other normal pregnancy, unspecified trimester: Secondary | ICD-10-CM | POA: Insufficient documentation

## 2022-12-24 NOTE — Progress Notes (Signed)
INITIAL OBSTETRICAL VISIT Patient name: Christina [name] MRN [id number]  Date of birth: 22-Sep-1996 Chief Complaint:   No chief complaint on file.  History of Present Illness:   Christina [name] is a 27 y.o. G43P1011 Hispanic female at [redacted]w[redacted]d by [place] at 48 weeks with an Estimated Date of Delivery: 03/30/23 being seen today for her initial obstetrical visit.   Her obstetrical history is significant for intrauterine growth restriction (IUGR) and Skenes duct cyst, short cervix, anxiety, lowlying placenta (now resolved), Prior cesarean delivery (for NRFHR) .   Today she reports no complaints.     12/24/2022   11:40 AM 08/31/2019    8:30 AM  Depression screen PHQ 2/9  Decreased Interest 3 2  Down, Depressed, Hopeless 1 3  PHQ - 2 Score 4 5  Altered sleeping 0 2  Tired, decreased energy 3 2  Change in appetite 0 0  Feeling bad or failure about yourself  0 1  Trouble concentrating 1 0  Moving slowly or fidgety/restless 0 0  Suicidal thoughts 0 0  PHQ-9 Score 8 10    Patient's last menstrual period was 06/23/2022. Last pap 07/2022. Results were: normal Review of Systems:   Pertinent items are noted in HPI Denies cramping/contractions, leakage of fluid, vaginal bleeding, abnormal vaginal discharge w/ itching/odor/irritation, headaches, visual changes, shortness of breath, chest pain, abdominal pain, severe nausea/vomiting, or problems with urination or bowel movements unless otherwise stated above.  Pertinent History Reviewed:  Reviewed past medical,surgical, social, obstetrical and family history.  Reviewed problem list, medications and allergies. OB History  Gravida Para Term Preterm AB Living  3 1 1  0 1 1  SAB IAB Ectopic Multiple Live Births  1 0 0 0 1    # Outcome Date GA Lbr Len/2nd Weight Sex Delivery Anes PTL Lv  3 Current           2 Term 02/24/21    F CS-Unspec   LIV  1 SAB            Physical Assessment:   Vitals:   12/24/22 0917  BP: 112/74  Pulse: 99  Weight: 197 lb (89.4  kg)  Body mass index is 36.03 kg/m.       Physical Examination:  General appearance - well appearing, and in no distress  Mental status - alert, oriented to person, place, and time  Psych:  She has a normal mood and affect  Skin - warm and dry, normal color, no suspicious lesions noted  Chest - effort normal, all lung fields clear to auscultation bilaterally  Heart - normal rate and regular rhythm  Abdomen - soft, nontender  Extremities:  No swelling or varicosities noted  Pelvic - VULVA: normal appearing vulva with no masses, tenderness or lesions  VAGINA: normal appearing vagina with normal color and discharge, no lesions  CERVIX: normal appearing cervix without discharge or lesions, no CMT  Thin prep pap is not done   No results found for this or any previous visit (from the past 24 hour(s)).  Assessment & Plan:  1) High-Risk Pregnancy G3P1011 at [redacted]w[redacted]d with an Estimated Date of Delivery: 03/30/23  Plans to breastfeed only colostrum then bottle feeding  2) Initial OB visit  3) Prior Cesarean Delivery, plans TOLAC ([facility])  4)   Skenes duct cyst (prefers surgery postpartu)  Referral to UroGyne to discuss PP surgery options  5)  Anxiety Referred to [facility] for eval/tx  6)  Short Cervix  Taking Prometrium, check  CL with [place]  7)  Low lying placent, resolved (now posterior)  Meds: Prometrium, Iron, zofran, PNV  Initial labs obtained Continue prenatal vitamins Reviewed n/v relief measures and warning s/s to report Reviewed recommended weight gain based on pre-gravid BMI Encouraged well-balanced diet Genetic & carrier screening discussed: normal  Ultrasound discussed; fetal survey: ordered  Will check cervical length CCNC completed> form faxed if has or is planning to apply for medicaid The nature of [facility] [employer] with multiple MDs and other Advanced Practice Providers was explained to patient; also emphasized that fellows, residents,  and students are part of our team.    Indications for ASA therapy (per uptodate) Discussed by other doctor at [facility], recommended but ultimately not ordered  Glucola this week (not  fasting today)  Follow-up: 2 weeks   Orders Placed This Encounter  Procedures   [place] MFM OB FOLLOW UP   US MFM OB DETAIL +14 WK   [place] MFM OB Transvaginal   Ambulatory referral to Integrated Behavioral Health   Ambulatory referral to Urogynecology    Wynelle Bourgeois [name], [facility] 12/24/2022 12:18 PM

## 2023-01-07 ENCOUNTER — Ambulatory Visit: Payer: Medicaid Other | Attending: Advanced Practice Midwife

## 2023-01-07 ENCOUNTER — Encounter: Payer: Self-pay | Admitting: Advanced Practice Midwife

## 2023-01-07 ENCOUNTER — Other Ambulatory Visit: Payer: Self-pay

## 2023-01-07 ENCOUNTER — Ambulatory Visit: Payer: Medicaid Other

## 2023-01-07 VITALS — BP 130/72 | HR 83

## 2023-01-07 DIAGNOSIS — O4443 Low lying placenta NOS or without hemorrhage, third trimester: Secondary | ICD-10-CM | POA: Diagnosis not present

## 2023-01-07 DIAGNOSIS — O4440 Low lying placenta NOS or without hemorrhage, unspecified trimester: Secondary | ICD-10-CM | POA: Insufficient documentation

## 2023-01-07 DIAGNOSIS — Z3A28 28 weeks gestation of pregnancy: Secondary | ICD-10-CM | POA: Diagnosis not present

## 2023-01-07 DIAGNOSIS — Z98891 History of uterine scar from previous surgery: Secondary | ICD-10-CM

## 2023-01-07 DIAGNOSIS — O09293 Supervision of pregnancy with other poor reproductive or obstetric history, third trimester: Secondary | ICD-10-CM | POA: Diagnosis not present

## 2023-01-07 DIAGNOSIS — O26873 Cervical shortening, third trimester: Secondary | ICD-10-CM | POA: Diagnosis not present

## 2023-01-07 DIAGNOSIS — F419 Anxiety disorder, unspecified: Secondary | ICD-10-CM | POA: Diagnosis not present

## 2023-01-07 DIAGNOSIS — O34219 Maternal care for unspecified type scar from previous cesarean delivery: Secondary | ICD-10-CM

## 2023-01-07 DIAGNOSIS — Z3480 Encounter for supervision of other normal pregnancy, unspecified trimester: Secondary | ICD-10-CM | POA: Diagnosis present

## 2023-01-07 DIAGNOSIS — Z363 Encounter for antenatal screening for malformations: Secondary | ICD-10-CM | POA: Insufficient documentation

## 2023-01-07 DIAGNOSIS — O99343 Other mental disorders complicating pregnancy, third trimester: Secondary | ICD-10-CM | POA: Diagnosis not present

## 2023-01-07 DIAGNOSIS — E669 Obesity, unspecified: Secondary | ICD-10-CM

## 2023-01-07 DIAGNOSIS — O99213 Obesity complicating pregnancy, third trimester: Secondary | ICD-10-CM

## 2023-01-07 DIAGNOSIS — Z8759 Personal history of other complications of pregnancy, childbirth and the puerperium: Secondary | ICD-10-CM

## 2023-01-08 ENCOUNTER — Other Ambulatory Visit: Payer: Medicaid Other

## 2023-01-08 ENCOUNTER — Ambulatory Visit: Payer: Medicaid Other

## 2023-01-09 ENCOUNTER — Ambulatory Visit (INDEPENDENT_AMBULATORY_CARE_PROVIDER_SITE_OTHER): Payer: Medicaid Other | Admitting: Family Medicine

## 2023-01-09 VITALS — BP 122/71 | HR 96 | Wt 198.0 lb

## 2023-01-09 DIAGNOSIS — O26873 Cervical shortening, third trimester: Secondary | ICD-10-CM

## 2023-01-09 DIAGNOSIS — Z3480 Encounter for supervision of other normal pregnancy, unspecified trimester: Secondary | ICD-10-CM

## 2023-01-09 DIAGNOSIS — N368 Other specified disorders of urethra: Secondary | ICD-10-CM

## 2023-01-09 DIAGNOSIS — Z98891 History of uterine scar from previous surgery: Secondary | ICD-10-CM

## 2023-01-09 DIAGNOSIS — Z8759 Personal history of other complications of pregnancy, childbirth and the puerperium: Secondary | ICD-10-CM

## 2023-01-09 DIAGNOSIS — Z3A28 28 weeks gestation of pregnancy: Secondary | ICD-10-CM

## 2023-01-09 NOTE — Progress Notes (Signed)
   PRENATAL VISIT NOTE  Subjective:  Christina [name] is a 27 y.o. G3P1011 at [redacted]w[redacted]d being seen today for ongoing prenatal care.  She is currently monitored for the following issues for this high-risk pregnancy and has Supervision of other normal pregnancy, antepartum; Anxiety; Eczema, dyshidrotic; Short cervical length during pregnancy; Skene's duct cyst; History of cesarean delivery; History of prior pregnancy with IUGR newborn; and SGA (small for gestational age) on their problem list.  Patient reports no complaints.  Contractions: Not present. Vag. Bleeding: None.  Movement: Present. Denies leaking of fluid.   The following portions of the patient's history were reviewed and updated as appropriate: allergies, current medications, past family history, past medical history, past social history, past surgical history and problem list.   Objective:   Vitals:   01/09/23 0854  BP: 122/71  Pulse: 96  Weight: 198 lb (89.8 kg)    Fetal Status: Fetal Heart Rate (bpm): 148 Fundal Height: 28 cm Movement: Present  Presentation: Vertex  General:  Alert, oriented and cooperative. Patient is in no acute distress.  Skin: Skin is warm and dry. No rash noted.   Cardiovascular: Normal heart rate noted  Respiratory: Normal respiratory effort, no problems with respiration noted  Abdomen: Soft, gravid, appropriate for gestational age.  Pain/Pressure: Present (occasional pain- lower abdomen)     Pelvic: Cervical exam deferred        Extremities: Normal range of motion.  Edema: Trace  Mental Status: Normal mood and affect. Normal behavior. Normal judgment and thought content.   Assessment and Plan:  Pregnancy: G3P1011 at [redacted]w[redacted]d 1. Supervision of other normal pregnancy, antepartum FHT and FH normal - Glucose Tolerance, 2 Hours w/1 Hour - RPR - HIV antibody (with reflex) - CBC  2. [redacted] weeks gestation of pregnancy - Glucose Tolerance, 2 Hours w/1 Hour - RPR - HIV antibody (with reflex) - CBC  3.  Short cervical length during pregnancy in third trimester CL 2.8cm@17  wk, 2.6cm@20  wk  2cm@23 &28 [place] 1/23 - CL 2.2cm On progesterone.  4. Skene's duct cyst Referred to urogyn.  5. History of cesarean delivery C/s for fetal bradycardia. TOLAC form signed  6. History of prior pregnancy with IUGR newborn EFW 13%  Preterm labor symptoms and general obstetric precautions including but not limited to vaginal bleeding, contractions, leaking of fluid and fetal movement were reviewed in detail with the patient. Please refer to After Visit Summary for other counseling recommendations.   No follow-ups on file.  Future Appointments  Date Time Provider [place]  01/09/2023 10:35 AM Truett Mainland, DO [facility] None  01/15/2023  2:00 PM Lynnea Ferrier, LCSW [facility] None  01/21/2023 10:55 AM Gavin Pound, [name] [facility] None  02/04/2023 10:55 AM Seabron Spates, [name] [facility] None  02/04/2023  2:30 PM [facility] NURSE [facility]  02/04/2023  2:45 PM [facility] US6 [facility]  02/18/2023 10:55 AM Seabron Spates, [name] [facility] None  03/04/2023 11:15 AM Seabron Spates, [name] [facility] None    Truett Mainland, DO

## 2023-01-10 LAB — CBC
Hematocrit: 32 % — ABNORMAL LOW (ref 34.0–46.6)
Hemoglobin: 10.1 g/dL — ABNORMAL LOW (ref 11.1–15.9)
MCH: 27.7 pg (ref 26.6–33.0)
MCHC: 31.6 g/dL (ref 31.5–35.7)
MCV: 88 fL (ref 79–97)
Platelets: 282 10*3/uL (ref 150–450)
RBC: 3.65 x10E6/uL — ABNORMAL LOW (ref 3.77–5.28)
RDW: 13.6 % (ref 11.7–15.4)
WBC: 7.3 10*3/uL (ref 3.4–10.8)

## 2023-01-10 LAB — HIV ANTIBODY (ROUTINE TESTING W REFLEX): HIV Screen 4th Generation wRfx: NONREACTIVE

## 2023-01-10 LAB — RPR: RPR Ser Ql: NONREACTIVE

## 2023-01-10 LAB — GLUCOSE TOLERANCE, 2 HOURS W/ 1HR
Glucose, 1 hour: 128 mg/dL (ref 70–179)
Glucose, 2 hour: 191 mg/dL — ABNORMAL HIGH (ref 70–152)
Glucose, Fasting: 93 mg/dL — ABNORMAL HIGH (ref 70–91)

## 2023-01-14 ENCOUNTER — Encounter: Payer: Self-pay | Admitting: Licensed Clinical Social Worker

## 2023-01-14 ENCOUNTER — Telehealth: Payer: Self-pay

## 2023-01-14 DIAGNOSIS — O24419 Gestational diabetes mellitus in pregnancy, unspecified control: Secondary | ICD-10-CM

## 2023-01-14 MED ORDER — ACCU-CHEK GUIDE W/DEVICE KIT: PACK | 0 refills | Status: DC

## 2023-01-14 MED ORDER — GLUCOSE BLOOD VI STRP: ORAL_STRIP | 12 refills | Status: DC

## 2023-01-14 MED ORDER — ACCU-CHEK SOFTCLIX LANCETS MISC: 12 refills | Status: DC

## 2023-01-14 NOTE — Telephone Encounter (Signed)
-----  Message from Maurine Minister, [place] sent at 01/14/2023 11:26 AM EST ----- Regarding: Lab Results Patient received labs from 01/09/2023.  Looks like abnormal glucose test.  Would like a call.

## 2023-01-14 NOTE — Telephone Encounter (Signed)
Called patient back to inform her that her 2 hr GTT was elevated and that she has Gestational diabetes. Patient was made aware that she will be referred to Diabetes education and testing supplies will be sent to her pharmacy. Understanding was voiced. Danniell Rotundo l Lokelani Lutes, CMA

## 2023-01-15 ENCOUNTER — Ambulatory Visit (INDEPENDENT_AMBULATORY_CARE_PROVIDER_SITE_OTHER): Payer: Medicaid Other | Admitting: Licensed Clinical Social Worker

## 2023-01-15 DIAGNOSIS — F419 Anxiety disorder, unspecified: Secondary | ICD-10-CM

## 2023-01-15 NOTE — BH Specialist Note (Signed)
Integrated Behavioral Health via Telemedicine Visit  01/15/2023 Dezirea [name] [id number]  Number of [place] Clinician visits: No data recorded Session Start time: No data recorded  Session End time: No data recorded Total time in minutes: No data recorded  Referring Provider: *** Patient/Family location: *** [facility] Provider location: *** All persons participating in visit: *** Types of Service: {CHL AMB TYPE OF [phone]}  I connected with [name] and/or [name]'s {family members:20773} via  Telephone or Geologist, engineering  (Video is Caregility application) and verified that I am speaking with the correct person using two identifiers. Discussed confidentiality: {YES/NO:21197}  I discussed the limitations of telemedicine and the availability of in person appointments.  Discussed there is a possibility of technology failure and discussed alternative modes of communication if that failure occurs.  I discussed that engaging in this telemedicine visit, they consent to the provision of behavioral healthcare and the services will be billed under their insurance.  Patient and/or legal guardian expressed understanding and consented to Telemedicine visit: {YES/NO:21197}  Presenting Concerns: Patient and/or family reports the following symptoms/concerns: *** Duration of problem: ***; Severity of problem: {Mild/Moderate/Severe:20260}  Patient and/or Family's Strengths/Protective Factors: {CHL AMB BH PROTECTIVE [phone]}  Goals Addressed: Patient will:  Reduce symptoms of: {IBH Symptoms:21014056}   Increase knowledge and/or ability of: {IBH Patient Tools:21014057}   Demonstrate ability to: {IBH Goals:21014053}  Progress towards Goals: {CHL AMB BH PROGRESS TOWARDS GOALS:[phone]}  Interventions: Interventions utilized:  {IBH Interventions:21014054} Standardized Assessments completed: {IBH Screening  Tools:21014051}  Patient and/or Family Response: ***  Assessment: Patient currently experiencing ***.   Patient may benefit from ***.  Plan: Follow up with behavioral health clinician on : *** Behavioral recommendations: *** Referral(s): {IBH Referrals:21014055}  I discussed the assessment and treatment plan with the patient and/or parent/guardian. They were provided an opportunity to ask questions and all were answered. They agreed with the plan and demonstrated an understanding of the instructions.   They were advised to call back or seek an in-person evaluation if the symptoms worsen or if the condition fails to improve as anticipated.  Lynnea Ferrier, LCSW

## 2023-01-16 ENCOUNTER — Encounter: Payer: Self-pay | Admitting: Family Medicine

## 2023-01-16 DIAGNOSIS — O24419 Gestational diabetes mellitus in pregnancy, unspecified control: Secondary | ICD-10-CM | POA: Insufficient documentation

## 2023-01-21 ENCOUNTER — Inpatient Hospital Stay (HOSPITAL_COMMUNITY)
Admission: AD | Admit: 2023-01-21 | Discharge: 2023-01-21 | Disposition: A | Discharge status: Home / Self Care | Payer: Medicaid Other | Attending: Obstetrics & Gynecology | Admitting: Obstetrics & Gynecology

## 2023-01-21 ENCOUNTER — Ambulatory Visit (INDEPENDENT_AMBULATORY_CARE_PROVIDER_SITE_OTHER): Payer: Medicaid Other

## 2023-01-21 ENCOUNTER — Inpatient Hospital Stay (HOSPITAL_BASED_OUTPATIENT_CLINIC_OR_DEPARTMENT_OTHER): Payer: Medicaid Other

## 2023-01-21 ENCOUNTER — Other Ambulatory Visit: Payer: Self-pay

## 2023-01-21 ENCOUNTER — Encounter (HOSPITAL_COMMUNITY): Payer: Self-pay | Admitting: Obstetrics & Gynecology

## 2023-01-21 VITALS — BP 117/74 | HR 101 | Wt 198.0 lb

## 2023-01-21 DIAGNOSIS — O09293 Supervision of pregnancy with other poor reproductive or obstetric history, third trimester: Secondary | ICD-10-CM | POA: Diagnosis not present

## 2023-01-21 DIAGNOSIS — O4703 False labor before 37 completed weeks of gestation, third trimester: Secondary | ICD-10-CM

## 2023-01-21 DIAGNOSIS — F419 Anxiety disorder, unspecified: Secondary | ICD-10-CM | POA: Insufficient documentation

## 2023-01-21 DIAGNOSIS — Z3A30 30 weeks gestation of pregnancy: Secondary | ICD-10-CM | POA: Diagnosis not present

## 2023-01-21 DIAGNOSIS — R102 Pelvic and perineal pain: Secondary | ICD-10-CM | POA: Diagnosis present

## 2023-01-21 DIAGNOSIS — O09299 Supervision of pregnancy with other poor reproductive or obstetric history, unspecified trimester: Secondary | ICD-10-CM | POA: Insufficient documentation

## 2023-01-21 DIAGNOSIS — O34219 Maternal care for unspecified type scar from previous cesarean delivery: Secondary | ICD-10-CM

## 2023-01-21 DIAGNOSIS — O99213 Obesity complicating pregnancy, third trimester: Secondary | ICD-10-CM | POA: Diagnosis not present

## 2023-01-21 DIAGNOSIS — O26879 Cervical shortening, unspecified trimester: Secondary | ICD-10-CM

## 2023-01-21 DIAGNOSIS — R8789 Other abnormal findings in specimens from female genital organs: Secondary | ICD-10-CM

## 2023-01-21 DIAGNOSIS — M549 Dorsalgia, unspecified: Secondary | ICD-10-CM | POA: Diagnosis not present

## 2023-01-21 DIAGNOSIS — Z369 Encounter for antenatal screening, unspecified: Secondary | ICD-10-CM | POA: Insufficient documentation

## 2023-01-21 DIAGNOSIS — O99891 Other specified diseases and conditions complicating pregnancy: Secondary | ICD-10-CM

## 2023-01-21 DIAGNOSIS — O99343 Other mental disorders complicating pregnancy, third trimester: Secondary | ICD-10-CM

## 2023-01-21 DIAGNOSIS — O3433 Maternal care for cervical incompetence, third trimester: Secondary | ICD-10-CM

## 2023-01-21 DIAGNOSIS — O26893 Other specified pregnancy related conditions, third trimester: Secondary | ICD-10-CM | POA: Diagnosis present

## 2023-01-21 DIAGNOSIS — Z3480 Encounter for supervision of other normal pregnancy, unspecified trimester: Secondary | ICD-10-CM

## 2023-01-21 DIAGNOSIS — E669 Obesity, unspecified: Secondary | ICD-10-CM

## 2023-01-21 DIAGNOSIS — O4440 Low lying placenta NOS or without hemorrhage, unspecified trimester: Secondary | ICD-10-CM

## 2023-01-21 DIAGNOSIS — O26873 Cervical shortening, third trimester: Secondary | ICD-10-CM

## 2023-01-21 DIAGNOSIS — O26899 Other specified pregnancy related conditions, unspecified trimester: Secondary | ICD-10-CM

## 2023-01-21 DIAGNOSIS — O4443 Low lying placenta NOS or without hemorrhage, third trimester: Secondary | ICD-10-CM

## 2023-01-21 DIAGNOSIS — O24419 Gestational diabetes mellitus in pregnancy, unspecified control: Secondary | ICD-10-CM

## 2023-01-21 LAB — WET PREP, GENITAL
Clue Cells Wet Prep HPF POC: NONE SEEN
Sperm: NONE SEEN
Trich, Wet Prep: NONE SEEN
WBC, Wet Prep HPF POC: <10 (ref <10)
Yeast Wet Prep HPF POC: NONE SEEN

## 2023-01-21 LAB — URINALYSIS, ROUTINE W REFLEX MICROSCOPIC
Bilirubin Urine: NEGATIVE
Glucose, UA: NEGATIVE mg/dL
Hgb urine dipstick: NEGATIVE
Ketones, ur: NEGATIVE mg/dL
Leukocytes,Ua: NEGATIVE
Nitrite: NEGATIVE
Protein, ur: NEGATIVE mg/dL
Specific Gravity, Urine: 1.015 (ref 1.005–1.030)
pH: 6 (ref 5.0–8.0)

## 2023-01-21 LAB — GLUCOSE, CAPILLARY: Glucose-Capillary: 96 mg/dL (ref 70–99)

## 2023-01-21 LAB — FETAL FIBRONECTIN: Fetal Fibronectin: POSITIVE — AB

## 2023-01-21 MED ORDER — BETAMETHASONE SOD PHOS & ACET 6 (3-3) MG/ML IJ SUSP
12.0000 mg | Freq: Once | INTRAMUSCULAR | Status: AC
  Administered 2023-01-21: 12 mg via INTRAMUSCULAR
  Filled 2023-01-21: qty 5

## 2023-01-21 NOTE — Progress Notes (Signed)
[facility] OFFICE VISIT  Patient name: Christina [name] MRN [id number]  Date of birth: 08-May-1996 Chief Complaint:   Routine Prenatal Visit  Subjective:   Christina [name] is a 27 y.o. G24P1011 female at [redacted]w[redacted]d with an Estimated Date of Delivery: 03/30/23 being seen today for ongoing management of a high-risk pregnancy aeb has Supervision of other normal pregnancy, antepartum; Anxiety; Eczema, dyshidrotic; Short cervical length during pregnancy; Skene's duct cyst; History of cesarean delivery; History of prior pregnancy with IUGR newborn; SGA (small for gestational age); and Gestational diabetes mellitus on their problem list.  Patient presents today, alone, with backache, contractions since Sunday, and vaginal pain/pressure .  She states the vaginal pain is sharp and varies in duration.  She states her contractions are like a "tightening that is ongoing."  Patient endorses fetal movement.  Patient denies vaginal concerns including abnormal discharge, leaking of fluid, and bleeding. No issues with urination, constipation, or diarrhea. However, patient admits that she remains sexually active and was not informed of the need to maintain pelvic rest in setting of short cervix. Review of chart is concerning for low-lying placenta but discrepancies, in said charting, makes this diagnosis questionable.  Patient also appears very anxious and endorses anxiety.  States she has improved and can come out of house alone, but struggles at times. Also reports having mood swings that she feels may have lasting affects on her toddler. Questions if she would benefit for medications.    Contractions: Irritability. Vag. Bleeding: None.  Movement: Present.  Reviewed past medical,surgical, social, obstetrical and family history as well as problem list, medications and allergies.  Objective   Vitals:   01/21/23 1100  BP: 117/74  Pulse: (!) 101  Weight: 198 lb (89.8 kg)  Body mass index is 36.21 kg/m.  Total  Weight Gain:10 lb (4.536 kg)         Physical Examination:   General appearance: Well appearing, and in no distress  Mental status: Alert, oriented to person, place, and time  Skin: Warm & dry  Cardiovascular: Normal heart rate noted  Respiratory: Normal respiratory effort, no distress  Abdomen: Soft, gravid, nontender, LGA with Fundal Height: 33 cm  Pelvic:  Speculum exam: Urethral cyst noted at introitus. Nontender. Vaginal vault pink and without inflammation or copious amts of discharge. Discharge thin, white. Cervix appears closed without lesions, cysts or polyps.  No discharge from os. BME deferred   Dilation:  (Closed visually)        Extremities: Edema: None  Fetal Status: Fetal Heart Rate (bpm): 146  Movement: Present   No results found for this or any previous visit (from the past 24 hour(s)).  Assessment & Plan:  High-risk pregnancy of a 27 y.o., G3P1011 at [redacted]w[redacted]d with an Estimated Date of Delivery: 03/30/23   1. Supervision of other normal pregnancy, antepartum -Anticipatory guidance for upcoming appts. -Patient to schedule next appt in 2 weeks for an in-person visit. -Reviewed transportation issues. Instructed to contact office 2-3 days prior to next appt to arrange transport if necessary.   2. [redacted] weeks gestation of pregnancy -Reviewed patient concerns. -Informed that speculum exam would be performed, but BME will be deferred d/t questionable LLP. -Patient states she is unaware if placenta is still LL.   3. Gestational diabetes mellitus (GDM), antepartum, gestational diabetes method of control unspecified -Elevated fasting and 2 hr on 3hr. -Reports no education and has yet to obtain supplies.  -Attempted random glucose today, but office glucometer not functioning properly. -Reports eating  Protein Shake and chicken for dinner and Sausage Mcgriddle and OJ for breakfast.  -Repeat order for diabetic education placed.  -Plan for random glucose in MAU.   4. Short cervix,  antepartum -Noted on 1/23 [place]: 2.2cm -In setting of ctx, back pain, and vaginal pain/pressure will send to MAU for evaluation. -MAU providers notified.   5. Low-lying placenta -Patient with reports LLP at initial visit. -[place] on 1/23 recognizes LLP as indication, but no notes in placental evaluation or MD summary. -Scheduled for next [place] on 02/04/2023  6. Anxiety -Endorses anxiety today. -Recognizes that she is improving, but has some days that are better than others. -Patient expresses desire for medications to assist in treatment. -Reviewed risks, side effects, and benefits of oral therapy in addition to therapy. -Encouraged to consider pros vs cons as well as speak with support persons. -Instructed to make provider aware of desires at next visit or send mychart message. -Patient agreeable.  -Next [facility] appt on 2/14.     Meds: No orders of the defined types were placed in this encounter.  Labs/procedures today:  Lab Orders  No laboratory test(s) ordered today     Reviewed: Preterm labor symptoms and general obstetric precautions including but not limited to vaginal bleeding, contractions, leaking of fluid and fetal movement were reviewed in detail with the patient.  All questions were answered.  Follow-up: Return in about 2 weeks (around 02/04/2023) for [place].  No orders of the defined types were placed in this encounter.  [name], [name] 01/21/2023

## 2023-01-21 NOTE — MAU Provider Note (Addendum)
History     CSN: [id number]  Arrival date and time: 01/21/23 1340   Event Date/Time   First Provider Initiated Contact with Patient 01/21/23 1420      Chief Complaint  Patient presents with   Pelvic Pain   HPI Patient is 27 y.o. [id number] [redacted]w[redacted]d here with complaints of pressure and contractions.  On Sunday at about 8:30PM patient was picking up toys and has sudden strong pain in her abdomen. This was associated with feeling "deep and intense pressure" and lasted for about 1 hour. She tried drinking water but threw it up.  She reports continued contractions since Sunday.  Reports she continues to have pelvic pressure. Also reports pain that moves down her lower abdomen to vagina.    She was seen at [facility] office and reported the same. She did have speculum exam in the office.   Last ate at about 1:00/1:30- steak and cheese sub with fries.   She has a known shortened cervix (2.2cm) and history of a low lying placenta that was resolved on 01/07/23 scan. Last pregnancy delivered at term.   +FM, denies LOF, VB, contractions, vaginal discharge.  OB History     Gravida  3   Para  1   Term  1   Preterm  0   AB  1   Living  1      SAB  1   IAB  0   Ectopic  0   Multiple  0   Live Births  1           Past Medical History:  Diagnosis Date   Medical history non-contributory     Past Surgical History:  Procedure Laterality Date   CESAREAN SECTION     WISDOM TOOTH EXTRACTION      Family History  Problem Relation Age of Onset   Asthma Mother    Hypertension Maternal Grandmother    Cancer Neg Hx    Diabetes Neg Hx    Heart disease Neg Hx    Stroke Neg Hx     Social History   Tobacco Use   Smoking status: Never   Smokeless tobacco: Never  Vaping Use   Vaping Use: Never used  Substance Use Topics   Alcohol use: Not Currently   Drug use: Not Currently    Types: Marijuana    Allergies:  Allergies  Allergen Reactions   Pollen Extract    Latex Rash     Medications Prior to Admission  Medication Sig Dispense Refill Last Dose   Ferrous Sulfate Dried (SLOW RELEASE IRON) 45 MG TBCR Take by mouth.   Past Week   Prenatal Vit-Fe Fumarate-FA (MULTIVITAMIN-PRENATAL) 27-0.8 MG TABS tablet Take 1 tablet by mouth daily at 12 noon.   Past Week   progesterone (PROMETRIUM) 200 MG capsule Place vaginally.   Past Week   Accu-Chek Softclix Lancets lancets DX: O24.419;Check BS 4 times a day. (Patient not taking: Reported on 01/21/2023) 100 each 12    Blood Glucose Monitoring Suppl (ACCU-CHEK GUIDE) w/Device KIT DX: O24.419;Check BS 4 times a day. (Patient not taking: Reported on 01/21/2023) 1 kit 0    glucose blood test strip DX: O24.419;Check BS 4 times a day. (Patient not taking: Reported on 01/21/2023) 100 each 12    ondansetron (ZOFRAN-ODT) 4 MG disintegrating tablet Take 4 mg by mouth every 8 (eight) hours as needed for nausea or vomiting. (Patient not taking: Reported on 01/21/2023)       Review of Systems  Constitutional:  Negative for chills and fever.  HENT:  Negative for congestion and sore throat.   Eyes:  Negative for pain and visual disturbance.  Respiratory:  Negative for cough, chest tightness and shortness of breath.   Cardiovascular:  Negative for chest pain.  Gastrointestinal:  Negative for abdominal pain, diarrhea, nausea and vomiting.  Endocrine: Negative for cold intolerance and heat intolerance.  Genitourinary:  Negative for dysuria and flank pain.  Musculoskeletal:  Negative for back pain.  Skin:  Negative for rash.  Allergic/Immunologic: Negative for food allergies.  Neurological:  Negative for dizziness and light-headedness.  Psychiatric/Behavioral:  Negative for agitation.    Physical Exam   Blood pressure 136/69, pulse (!) 104, temperature 98.7 F (37.1 C), temperature source Oral, resp. rate 16, height 5\' 3"  (1.6 m), weight 90.7 kg, last menstrual period 06/23/2022, SpO2 98 %, unknown if currently breastfeeding.  Physical  Exam Vitals and nursing note reviewed. Exam conducted with a chaperone present.  Constitutional:      General: She is not in acute distress.    Appearance: She is well-developed.     Comments: Pregnant female  HENT:     Head: Normocephalic and atraumatic.  Eyes:     General: No scleral icterus.    Conjunctiva/sclera: Conjunctivae normal.  Cardiovascular:     Rate and Rhythm: Normal rate.  Pulmonary:     Effort: Pulmonary effort is normal.  Chest:     Chest wall: No tenderness.  Abdominal:     Palpations: Abdomen is soft.     Tenderness: There is no abdominal tenderness. There is no guarding or rebound.     Comments: Gravid  Genitourinary:    Exam position: Lithotomy position.     Vagina: Normal.     Cervix: Normal.       Comments: Collected FFN blindly and then collected GC/CT and Wet prep Visually closed on exam, consistent with prior exam.  Musculoskeletal:        General: Normal range of motion.     Cervical back: Normal range of motion and neck supple.  Skin:    General: Skin is warm and dry.     Findings: No rash.  Neurological:     Mental Status: She is alert and oriented to person, place, and time.   Dilation: 1 Effacement (%): 50 Cervical Position: Middle Station: -3 Presentation: Vertex Exam by:: Lubertha Sayres, MD   MAU Course  Procedures  NST: 150/moderate/+accels, no decels Toco: quiet  Extended monitoring: monitored continuously for > 2 hours total time with no contraction seen on monitor  EFM is Reactive and reassuring  MDM: moderate  This patient presents to the ED for concern of   Chief Complaint  Patient presents with   Pelvic Pain     These complains involves an extensive number of treatment options, and is a complaint that carries with it a high risk of complications and morbidity.  The differential diagnosis for  1. Back pain with pelvic pressure INCLUDES preterm labor, infection, labor, braxton hick contractions, normal variant   Co  morbidities that complicate the patient evaluation:  Additional history obtained from Other Sister- at bedside  Interpreter services used: not applicable  External records from outside source obtained and reviewed including Scanned media records, CareEverywhere, and Prenatal care records  Lab Tests: UA, Wet prep, and Gonorrhea/Chlamydia , FFN  I ordered, and personally interpreted labs.  The pertinent results include:  wet prep, FFN, UA - FFN was positive which is concern  for preterm labor    Reevaluation of the patient after these medicines showed that the patient improved I have reviewed the patients home medicines and have made adjustments as needed   MAU Course: - performed initial exam-- blindly collected FFN and swabs - Performed speculum exam, visually closed - US performed to confirm no further cervical thinning, results finalized about 4PM and reviewed - Sterile vaginal exam performed which showed patient is 1/50/-3 with well applied fetal head. She has some mild ctx while I was in the room.   4:19-4:35 lengthy options discussion with patient and her sister who is at bedside. Considerations that are important to the patient--she has a toddler at home, husband is at work, currently toddler is in the care of elderly grandmother. The patient has limited transportation for even office visits. She is worried about lack of baby care items like car seat. She has not had her baby shower yet. Patient has many questions and asked multiple times "if he will be alright" and we discussed having NICU consult.   I offered her to plans of care 1) BMZ here at MAU, discharge home with repeat BMZ tomorrow at the office or MAU 2) Admission for possible preterm labor with repeat BMZ tomorrow and NICU consult.  Patient desires time to discuss with her sister and her husband.   5:59 PM patient desires discharge and plans to return tomorrow for BMZ#2. Patient with multiple questions about  transportation. Recommended discussing with her support people and calling her insurance.   After the interventions noted above, I reevaluated the patient and found that they have :improved  Dispostion: discharged   Assessment and Plan   1. Threatened preterm labor, third trimester   2. Supervision of other normal pregnancy, antepartum   3. Pelvic pressure in pregnancy   4. Positive fetal fibronectin at 22 weeks to [redacted] weeks gestation   5. Back pain affecting pregnancy in third trimester   6. Cervical insufficiency during pregnancy in third trimester, antepartum      Preterm labor - Cervical exam showed dilation, repeat US showed stable thickness 2.7cm - Positive FFN  - BMZ#1 given in MAU today today, needs repeat dose tomorrow 2/7 - patient was offered admission for repeat BMZ and monitoring for cervical change but was ""not prepared for that today" and desires to go home. She was able to voice reasons to return to hospital   [facility] 01/21/2023, 2:20 PM

## 2023-01-21 NOTE — Discharge Instructions (Signed)
Come to the MAU (maternity admission unit) for 1) Strong contractions every 5-10 minutes for at least 1 hour that do not go away when you drink water or take a warm shower. These contractions will be so strong all you can do is breath through them 2) Vaginal bleeding- anything more than spotting 3) Loss of fluid like you broke your water 4) Decreased movement of your baby

## 2023-01-21 NOTE — MAU Note (Signed)
Christina [name] is a 27 y.o. at [redacted]w[redacted]d here in MAU reporting: since Sunday night has been having contractions, back pain, and pressure. Since then has been having intermittent pains and pressure. Pain is daily and occurs 2-3 times per day. No bleeding or LOF. +FM  Onset of complaint: ongoing  Pain score: pressure 5/10, sharp cervical pain 6/10  Vitals:   01/21/23 1352  BP: 136/69  Pulse: (!) 104  Resp: 16  Temp: 98.7 F (37.1 C)  SpO2: 98%     FHT:158  Lab orders placed from triage: UA

## 2023-01-22 ENCOUNTER — Encounter (HOSPITAL_COMMUNITY): Payer: Self-pay | Admitting: Obstetrics & Gynecology

## 2023-01-22 ENCOUNTER — Inpatient Hospital Stay (HOSPITAL_COMMUNITY)
Admission: AD | Admit: 2023-01-22 | Discharge: 2023-01-22 | Disposition: A | Discharge status: Home / Self Care | Payer: Medicaid Other | Attending: Obstetrics & Gynecology | Admitting: Obstetrics & Gynecology

## 2023-01-22 DIAGNOSIS — Z3A30 30 weeks gestation of pregnancy: Secondary | ICD-10-CM | POA: Insufficient documentation

## 2023-01-22 DIAGNOSIS — O4703 False labor before 37 completed weeks of gestation, third trimester: Secondary | ICD-10-CM | POA: Diagnosis not present

## 2023-01-22 DIAGNOSIS — Z3480 Encounter for supervision of other normal pregnancy, unspecified trimester: Secondary | ICD-10-CM

## 2023-01-22 LAB — GC/CHLAMYDIA PROBE AMP (~~LOC~~) NOT AT ARMC
Chlamydia: NEGATIVE
Comment: NEGATIVE
Comment: NORMAL
Neisseria Gonorrhea: NEGATIVE

## 2023-01-22 MED ORDER — BETAMETHASONE SOD PHOS & ACET 6 (3-3) MG/ML IJ SUSP
12.0000 mg | Freq: Once | INTRAMUSCULAR | Status: AC
  Administered 2023-01-22: 12 mg via INTRAMUSCULAR
  Filled 2023-01-22: qty 5

## 2023-01-22 NOTE — MAU Provider Note (Signed)
Event Date/Time   First Provider Initiated Contact with Patient 01/22/23 1230      S Ms. Christina [name] is a 27 y.o. G3P1011 patient who presents to MAU today for second BMZ injection. She denies any contractions today.   O BP 126/75 (BP Location: Right Arm)   Pulse (!) 107   Temp 99 F (37.2 C) (Oral)   Resp 17   LMP 06/23/2022   SpO2 99%  Physical Exam  FHTs by doppler: 154 bpm  A Medical screening exam complete Preterm uterine contractions in third trimester, antepartum  [redacted] weeks gestation of pregnancy  P Discharge from MAU in stable condition Warning signs for worsening condition that would warrant emergency follow-up discussed Patient may return to MAU as needed for pregnancy complications   Laury Deep, [name] 01/22/2023 12:58 PM

## 2023-01-22 NOTE — MAU Note (Signed)
...  Savreen Gebhardt is a 27 y.o. at [redacted]w[redacted]d here in MAU reporting: Here for 2nd dose BMZ. Denies VB or LOF. Denies any complaints. +FM.   Pain score: Denies pain.  FHT: 154 doppler  Lab orders placed from triage:  BMZ order placed by Renato Battles, [name].

## 2023-01-29 ENCOUNTER — Ambulatory Visit (INDEPENDENT_AMBULATORY_CARE_PROVIDER_SITE_OTHER): Payer: Medicaid Other | Admitting: Licensed Clinical Social Worker

## 2023-01-29 ENCOUNTER — Ambulatory Visit: Payer: Medicaid Other

## 2023-01-29 DIAGNOSIS — F419 Anxiety disorder, unspecified: Secondary | ICD-10-CM

## 2023-01-30 NOTE — BH Specialist Note (Signed)
Integrated Behavioral Health via Telemedicine Visit  01/30/2023 Christina [name] BE:[id number]  Number of [place] Clinician visits: 2 Session Start time:  2:00pm Session End time: 3:06pm Total time in minutes: 53+ mins   Referring Provider: Milinda Cave [name] Patient/Family location: Home  [facility] Provider location: [place] All persons participating in visit: Pt S Raso and LCSW A Saia Derossett  Types of Service: Individual psychotherapy and Video visit  I connected with [name] and/or [name] via  Telephone or Video Enabled Telemedicine Application  (Video is Caregility application) and verified that I am speaking with the correct person using two identifiers. Discussed confidentiality: Yes   I discussed the limitations of telemedicine and the availability of in person appointments.  Discussed there is a possibility of technology failure and discussed alternative modes of communication if that failure occurs.  I discussed that engaging in this telemedicine visit, they consent to the provision of behavioral healthcare and the services will be billed under their insurance.  Patient and/or legal guardian expressed understanding and consented to Telemedicine visit: Yes   Presenting Concerns: Patient and/or family reports the following symptoms/concerns: anxiety Duration of problem: approx one year; Severity of problem: mild  Patient and/or Family's Strengths/Protective Factors: Concrete supports in place (healthy food, safe environments, etc.)  Goals Addressed: Patient will:  Reduce symptoms of: anxiety   Increase knowledge and/or ability of: coping skills   Demonstrate ability to: Increase adequate support systems for patient/family  Progress towards Goals: Ongoing  Interventions: Interventions utilized:  Motivational Interviewing and Supportive Counseling Standardized Assessments completed: n/a  Patient and/or Family Response: Christina [name] reports  difficulty in relationship with partner. Christina [name] reports not having reliable transportation. Christina [name]   Assessment: Patient currently experiencing anxiety.   Patient may benefit from integrated behavioral health.  Plan: Follow up with behavioral health clinician on : 3 weeks  Behavioral recommendations: Prioritize rest, practice active listening and communication with partner, create smart goals  Referral(s): [place] (In Clinic)  I discussed the assessment and treatment plan with the patient and/or parent/guardian. They were provided an opportunity to ask questions and all were answered. They agreed with the plan and demonstrated an understanding of the instructions.   They were advised to call back or seek an in-person evaluation if the symptoms worsen or if the condition fails to improve as anticipated.  Lynnea Ferrier, LCSW

## 2023-02-04 ENCOUNTER — Ambulatory Visit (INDEPENDENT_AMBULATORY_CARE_PROVIDER_SITE_OTHER): Payer: Medicaid Other | Admitting: Advanced Practice Midwife

## 2023-02-04 ENCOUNTER — Ambulatory Visit: Payer: Medicaid Other

## 2023-02-04 VITALS — BP 120/75 | HR 106 | Wt 201.0 lb

## 2023-02-04 DIAGNOSIS — O3433 Maternal care for cervical incompetence, third trimester: Secondary | ICD-10-CM

## 2023-02-04 DIAGNOSIS — Z3A32 32 weeks gestation of pregnancy: Secondary | ICD-10-CM

## 2023-02-04 MED ORDER — PROGESTERONE 200 MG PO CAPS: ORAL_CAPSULE | ORAL | 3 refills | Status: DC

## 2023-02-04 NOTE — Progress Notes (Addendum)
   PRENATAL VISIT NOTE  Subjective:  Christina [name] is a 27 y.o. G3P1011 at 11w2dbeing seen today for ongoing prenatal care.  She is currently monitored for the following issues for this high-risk pregnancy and has Supervision of other normal pregnancy, antepartum; Anxiety; Eczema, dyshidrotic; Short cervical length during pregnancy; Skene's duct cyst; History of cesarean delivery; History of prior pregnancy with IUGR newborn; SGA (small for gestational age); Gestational diabetes mellitus; and Preterm labor on their problem list.  Patient reports occasional contractions.  Contractions: Irritability. Vag. Bleeding: None.  Movement: Present. Denies leaking of fluid.   The following portions of the patient's history were reviewed and updated as appropriate: allergies, current medications, past family history, past medical history, past social history, past surgical history and problem list.   Objective:   Vitals:   02/04/23 1107  BP: 120/75  Pulse: (!) 106  Weight: 201 lb (91.2 kg)    Fetal Status: Fetal Heart Rate (bpm): 147   Movement: Present    Fundal height 32cm  General:  Alert, oriented and cooperative. Patient is in no acute distress.  Skin: Skin is warm and dry. No rash noted.   Cardiovascular: Normal heart rate noted  Respiratory: Normal respiratory effort, no problems with respiration noted  Abdomen: Soft, gravid, appropriate for gestational age.  Pain/Pressure: Present     Pelvic: Cervical exam deferred        Extremities: Normal range of motion.  Edema: None  Mental Status: Normal mood and affect. Normal behavior. Normal judgment and thought content.   Assessment and Plan:  Pregnancy: G3P1011 at 374w2d. [redacted] weeks gestation of pregnancy     Has doula, info given on how to get her [facility] Certified     Plans Mirena PP  2. Preterm labor in third trimester without delivery     Only occasional contractions     PTL precautions reviewed  3. Cervical insufficiency during  pregnancy in third trimester, antepartum     On prometrium, renewed Rx to be used until 36 wks  Preterm labor symptoms and general obstetric precautions including but not limited to vaginal bleeding, contractions, leaking of fluid and fetal movement were reviewed in detail with the patient. Please refer to After Visit Summary for other counseling recommendations.     Future Appointments  Date Time Provider [place]2/20/2024  2:30 PM [facility]2/20/2024  2:45 PM W[facility]-MFC US6 [facility]US [facility]/21/2024  9:00 AM NDOak GroveDM CLASS NDCarsonDM  02/18/2023 10:55 AM WiSeabron SpatesCNM CWH-WMHP None  02/24/2023  1:15 PM WMSt. [facility]MB[facility]3/19/2024 11:15 AM WiSeabron SpatesCNM CWH-WMHP None  03/18/2023 11:15 AM NeWende MottCNM CWH-WMHP None  03/25/2023 10:55 AM LaJorje GuildNP CWH-WMHP None    MaHansel FeinsteinCNM

## 2023-02-05 ENCOUNTER — Ambulatory Visit: Payer: Medicaid Other | Admitting: Registered"

## 2023-02-10 ENCOUNTER — Ambulatory Visit (HOSPITAL_BASED_OUTPATIENT_CLINIC_OR_DEPARTMENT_OTHER): Payer: Medicaid Other

## 2023-02-10 ENCOUNTER — Ambulatory Visit: Payer: Medicaid Other | Attending: Obstetrics | Admitting: *Deleted

## 2023-02-10 ENCOUNTER — Encounter: Payer: Self-pay | Admitting: *Deleted

## 2023-02-10 VITALS — BP 117/74 | HR 111

## 2023-02-10 DIAGNOSIS — O24410 Gestational diabetes mellitus in pregnancy, diet controlled: Secondary | ICD-10-CM | POA: Diagnosis not present

## 2023-02-10 DIAGNOSIS — Z8759 Personal history of other complications of pregnancy, childbirth and the puerperium: Secondary | ICD-10-CM | POA: Insufficient documentation

## 2023-02-10 DIAGNOSIS — O09293 Supervision of pregnancy with other poor reproductive or obstetric history, third trimester: Secondary | ICD-10-CM | POA: Insufficient documentation

## 2023-02-10 DIAGNOSIS — Z3480 Encounter for supervision of other normal pregnancy, unspecified trimester: Secondary | ICD-10-CM

## 2023-02-10 DIAGNOSIS — Z362 Encounter for other antenatal screening follow-up: Secondary | ICD-10-CM | POA: Diagnosis present

## 2023-02-10 DIAGNOSIS — Z98891 History of uterine scar from previous surgery: Secondary | ICD-10-CM

## 2023-02-10 DIAGNOSIS — O99343 Other mental disorders complicating pregnancy, third trimester: Secondary | ICD-10-CM | POA: Diagnosis not present

## 2023-02-10 DIAGNOSIS — O99213 Obesity complicating pregnancy, third trimester: Secondary | ICD-10-CM

## 2023-02-10 DIAGNOSIS — F419 Anxiety disorder, unspecified: Secondary | ICD-10-CM | POA: Insufficient documentation

## 2023-02-10 DIAGNOSIS — O26873 Cervical shortening, third trimester: Secondary | ICD-10-CM | POA: Insufficient documentation

## 2023-02-10 DIAGNOSIS — O34219 Maternal care for unspecified type scar from previous cesarean delivery: Secondary | ICD-10-CM

## 2023-02-10 DIAGNOSIS — O4703 False labor before 37 completed weeks of gestation, third trimester: Secondary | ICD-10-CM | POA: Diagnosis not present

## 2023-02-10 DIAGNOSIS — Z3A33 33 weeks gestation of pregnancy: Secondary | ICD-10-CM

## 2023-02-10 DIAGNOSIS — E669 Obesity, unspecified: Secondary | ICD-10-CM

## 2023-02-10 NOTE — BH Specialist Note (Deleted)
Integrated Behavioral Health via Telemedicine Visit  02/10/2023 Cherita Mayor BE:[id number]  Number of [place] Clinician visits: No data recorded Session Start time: No data recorded  Session End time: No data recorded Total time in minutes: No data recorded  Referring Provider: *** Patient/Family location: *** [facility] Provider location: *** All persons participating in visit: *** Types of Service: {CHL AMB TYPE OF SERVICE:[phone]}  I connected with [name] and/or Aranda Maysonet's {family members:20773} via  Telephone or Geologist, engineering  (Video is Caregility application) and verified that I am speaking with the correct person using two identifiers. Discussed confidentiality: {YES/NO:21197}  I discussed the limitations of telemedicine and the availability of in person appointments.  Discussed there is a possibility of technology failure and discussed alternative modes of communication if that failure occurs.  I discussed that engaging in this telemedicine visit, they consent to the provision of behavioral healthcare and the services will be billed under their insurance.  Patient and/or legal guardian expressed understanding and consented to Telemedicine visit: {YES/NO:21197}  Presenting Concerns: Patient and/or family reports the following symptoms/concerns: *** Duration of problem: ***; Severity of problem: {Mild/Moderate/Severe:20260}  Patient and/or Family's Strengths/Protective Factors: {CHL AMB BH PROTECTIVE FACTORS:[phone]}  Goals Addressed: Patient will:  Reduce symptoms of: {IBH Symptoms:21014056}   Increase knowledge and/or ability of: {IBH Patient Tools:21014057}   Demonstrate ability to: {IBH Goals:21014053}  Progress towards Goals: {CHL AMB BH PROGRESS TOWARDS GOALS:[phone]}  Interventions: Interventions utilized:  {IBH Interventions:21014054} Standardized Assessments completed: {IBH Screening  Tools:21014051}  Patient and/or Family Response: ***  Assessment: Patient currently experiencing ***.   Patient may benefit from ***.  Plan: Follow up with behavioral health clinician on : *** Behavioral recommendations: *** Referral(s): {IBH Referrals:21014055}  I discussed the assessment and treatment plan with the patient and/or parent/guardian. They were provided an opportunity to ask questions and all were answered. They agreed with the plan and demonstrated an understanding of the instructions.   They were advised to call back or seek an in-person evaluation if the symptoms worsen or if the condition fails to improve as anticipated.  Caroleen Hamman Indiya Izquierdo, LCSW

## 2023-02-12 ENCOUNTER — Encounter: Payer: Medicaid Other | Admitting: Registered"

## 2023-02-12 DIAGNOSIS — O24419 Gestational diabetes mellitus in pregnancy, unspecified control: Secondary | ICD-10-CM | POA: Insufficient documentation

## 2023-02-13 ENCOUNTER — Encounter: Payer: Self-pay | Admitting: Registered"

## 2023-02-13 ENCOUNTER — Other Ambulatory Visit: Payer: Self-pay | Admitting: *Deleted

## 2023-02-13 DIAGNOSIS — O26879 Cervical shortening, unspecified trimester: Secondary | ICD-10-CM

## 2023-02-13 DIAGNOSIS — O24410 Gestational diabetes mellitus in pregnancy, diet controlled: Secondary | ICD-10-CM

## 2023-02-13 DIAGNOSIS — Z8759 Personal history of other complications of pregnancy, childbirth and the puerperium: Secondary | ICD-10-CM

## 2023-02-13 DIAGNOSIS — O34219 Maternal care for unspecified type scar from previous cesarean delivery: Secondary | ICD-10-CM

## 2023-02-13 DIAGNOSIS — O4700 False labor before 37 completed weeks of gestation, unspecified trimester: Secondary | ICD-10-CM

## 2023-02-13 DIAGNOSIS — O99213 Obesity complicating pregnancy, third trimester: Secondary | ICD-10-CM

## 2023-02-13 NOTE — Patient Instructions (Signed)
Resources to help with food assistance:  [place] ([email or web address].org)  [employer] and [employer] were created in 2015. They may have been updated in 2018, but if you use these resources you may want to call ahead to make sure the information is still correct. Pantries: little blue book.. do a search and look for wordpress link. [place] computers block access because it is a blog site. Meals: the-little-green-book-03-16-2016.pdf (asimplegesturegso.org)  Mobile: Out of the [place] website updates their calendar monthly with times and locations  Some Farmers Markets accept EBT and sometimes you can find some that will double your benefits.  [employer] is one that accepts EBT: [employer], [place] ([place].org)

## 2023-02-13 NOTE — Progress Notes (Signed)
The following learning objectives were met by the patient during this course:   States the definition of Gestational Diabetes States why dietary management is important in controlling blood glucose Describes the effects each nutrient has on blood glucose levels Demonstrates ability to create a balanced meal plan Demonstrates carbohydrate counting  States when to check blood glucose levels Demonstrates proper blood glucose monitoring techniques States the effect of stress and exercise on blood glucose levels States the importance of limiting caffeine and abstaining from alcohol and smoking   Blood glucose monitor given: None. Patient has meter and checking blood sugar prior to class.   Patient instructed to monitor glucose levels: FBS: 60 - <95; 1 hour: <140; 2 hour: <120   Patient received handouts: Nutrition Diabetes and Pregnancy, including carb counting list Glucose log sheet   Patient will be seen for follow-up as needed. 

## 2023-02-18 ENCOUNTER — Ambulatory Visit (INDEPENDENT_AMBULATORY_CARE_PROVIDER_SITE_OTHER): Payer: Medicaid Other | Admitting: Advanced Practice Midwife

## 2023-02-18 VITALS — BP 120/85 | HR 100 | Wt 201.0 lb

## 2023-02-18 DIAGNOSIS — O24419 Gestational diabetes mellitus in pregnancy, unspecified control: Secondary | ICD-10-CM

## 2023-02-18 DIAGNOSIS — Z3A34 34 weeks gestation of pregnancy: Secondary | ICD-10-CM

## 2023-02-18 DIAGNOSIS — Z3480 Encounter for supervision of other normal pregnancy, unspecified trimester: Secondary | ICD-10-CM

## 2023-02-18 DIAGNOSIS — O4703 False labor before 37 completed weeks of gestation, third trimester: Secondary | ICD-10-CM

## 2023-02-18 NOTE — Progress Notes (Signed)
   PRENATAL VISIT NOTE  Subjective:  Christina [name] is a 27 y.o. G3P1011 at 38w2dbeing seen today for ongoing prenatal care.  She is currently monitored for the following issues for this high-risk pregnancy and has Supervision of other normal pregnancy, antepartum; Anxiety; Eczema, dyshidrotic; Short cervical length during pregnancy; Skene's duct cyst; History of cesarean delivery; History of prior pregnancy with IUGR newborn; SGA (small for gestational age); Gestational diabetes mellitus; and Preterm labor on their problem list.  Patient reports no complaints.  Contractions: Not present. Vag. Bleeding: None.  Movement: Present. Denies leaking of fluid.   The following portions of the patient's history were reviewed and updated as appropriate: allergies, current medications, past family history, past medical history, past social history, past surgical history and problem list.   Objective:   Vitals:   02/18/23 1104  BP: 120/85  Pulse: 100  Weight: 201 lb (91.2 kg)    Fetal Status: Fetal Heart Rate (bpm): 145 Fundal Height: 34 cm Movement: Present     General:  Alert, oriented and cooperative. Patient is in no acute distress.  Skin: Skin is warm and dry. No rash noted.   Cardiovascular: Normal heart rate noted  Respiratory: Normal respiratory effort, no problems with respiration noted  Abdomen: Soft, gravid, appropriate for gestational age.  Pain/Pressure: Absent     Pelvic: Cervical exam deferred        Extremities: Normal range of motion.  Edema: None  Mental Status: Normal mood and affect. Normal behavior. Normal judgment and thought content.   Assessment and Plan:  Pregnancy: G3P1011 at 392w2d. Supervision of other normal pregnancy, antepartum     Doing well  2. Threatened preterm labor, third trimester     No further PTL  3. Gestational diabetes mellitus (GDM), antepartum, gestational diabetes method of control unspecified      Fastings under 90, 2 hr PP under 120.  Was  also testing before meals, advised no need to do that.    Preterm labor symptoms and general obstetric precautions including but not limited to vaginal bleeding, contractions, leaking of fluid and fetal movement were reviewed in detail with the patient. Please refer to After Visit Summary for other counseling recommendations.     Future Appointments  Date Time Provider [place]/10[place]20[place]  1:15 PM [facility] 11:15 AM WiSeabron SpatesCNM [facility] None  03/10/2023  2:45 PM [facility]  3:00 PM [facility] US1 [facility]01[facility] 11:15 AM NeWende MottCNM [facility] None  03/25/2023 10:55 AM LaJorje GuildNP [facility] None    MaHansel FeinsteinCNM

## 2023-02-24 ENCOUNTER — Ambulatory Visit (INDEPENDENT_AMBULATORY_CARE_PROVIDER_SITE_OTHER): Payer: Medicaid Other | Admitting: Clinical

## 2023-02-24 DIAGNOSIS — F419 Anxiety disorder, unspecified: Secondary | ICD-10-CM

## 2023-02-24 NOTE — Patient Instructions (Addendum)
[facility] [address] [place], [place] [address] [phone] (main office) [phone] (Danylle Ouk's office)  New Parent Support Groups www.postpartum.net www.conehealthybaby.com     BRAINSTORMING  Develop a Plan Goals: Provide a way to start conversation about your new life with a baby Assist parents in recognizing and using resources within their reach Help pave the way before birth for an easier period of transition afterwards.  Make a list of the following information to keep in a central location: Full name of Mom and Partner: _____________________________________________ Baby's full name and Date of Birth: ___________________________________________ Home Address: ___________________________________________________________ ________________________________________________________________________ Home Phone: ____________________________________________________________ Parents' cell numbers: _____________________________________________________ ________________________________________________________________________ Name and contact info for OB: ______________________________________________ Name and contact info for Pediatrician:________________________________________ Contact info for Lactation Consultants: ________________________________________  REST and SLEEP *You each need at least 4-5 hours of uninterrupted sleep every day. Write specific names and contact information.* How are you going to rest in the postpartum period? While partner's home? When partner returns to work? When you both return to work? Where will your baby sleep? Who is available to help during the day? Evening? Night? Who could move in for a period to help support you? What are some ideas to help you get enough  sleep? __________________________________________________________________________________________________________________________________________________________________________________________________________________________________________ NUTRITIOUS FOOD AND DRINK *Plan for meals before your baby is born so you can have healthy food to eat during the immediate postpartum period.* Who will look after breakfast? Lunch? Dinner? List names and contact information. Brainstorm quick, healthy ideas for each meal. What can you do before baby is born to prepare meals for the postpartum period? How can others help you with meals? Which grocery stores provide online shopping and delivery? Which restaurants offer take-out or delivery options? ______________________________________________________________________________________________________________________________________________________________________________________________________________________________________________________________________________________________________________________________________________________________________________________________________  CARE FOR MOM *It's important that mom is cared for and pampered in the postpartum period. Remember, the most important ways new mothers need care are: sleep, nutrition, gentle exercise, and time off.* Who can come take care of mom during this period? Make a list of people with their contact information. List some activities that make you feel cared for, rested, and energized? Who can make sure you have opportunities to do these things? Does mom have a space of her very own within your home that's just for her? Make a "Mama Cave" where she can be comfortable, rest, and renew herself  daily. ______________________________________________________________________________________________________________________________________________________________________________________________________________________________________________________________________________________________________________________________________________________________________________________________________    CARE FOR AND FEEDING BABY *Knowledgeable and encouraging people will offer the best support with regard to feeding your baby.* Educate yourself and choose the best feeding option for your baby. Make a list of people who will guide, support, and be a resource for you as your care for and feed your baby. (Friends that have breastfed or are currently breastfeeding, lactation consultants, breastfeeding support groups, etc.) Consider a postpartum doula. (These websites can give you information: dona.org & padanc.org) Seek out local breastfeeding resources like the breastfeeding support group at [facility] or [employer]. ______________________________________________________________________________________________________________________________________________________________________________________________________________________________________________________________________________________________________________________________________________________________________________________________________  CHORES AND ERRANDS Who can help with a thorough cleaning before baby is born? Make a list of people who will help with housekeeping and chores, like laundry, light cleaning, dishes, bathrooms, etc. Who can run some errands for you? What can you do to make sure you are stocked with basic supplies before baby is born? Who is going to do the  shopping? ______________________________________________________________________________________________________________________________________________________________________________________________________________________________________________________________________________________________________________________________________________________________________________________________________     Family Adjustment *Nurture yourselves.it helps parents be more loving and allows for better bonding with their child.* What sorts   of things do you and partner enjoy doing together? Which activities help you to connect and strengthen your relationship? Make a list of those things. Make a list of people whom you trust to care for your baby so you can have some time together as a couple. What types of things help partner feel connected to Mom? Make a list. What needs will partner have in order to bond with baby? Other children? Who will care for them when you go into labor and while you are in the hospital? Think about what the needs of your older children might be. Who can help you meet those needs? In what ways are you helping them prepare for bringing baby home? List some specific strategies you have for family adjustment. _______________________________________________________________________________________________________________________________________________________________________________________________________________________________________________________________________________________________________________________________________________  SUPPORT *Someone who can empathize with experiences normalizes your problems and makes them more bearable.* Make a list of other friends, neighbors, and/or co-workers you know with infants (and small children, if applicable) with whom you can connect. Make a list of local or online support groups, mom groups, etc. in which you can be  involved. ______________________________________________________________________________________________________________________________________________________________________________________________________________________________________________________________________________________________________________________________________________________________________________________________________  Childcare Plans Investigate and plan for childcare if mom is returning to work. Talk about mom's concerns about her transition back to work. Talk about partner's concerns regarding this transition.  Mental Health *Your mental health is one of the highest priorities for a pregnant or postpartum mom.* 1 in 5 women experience anxiety and/or depression from the time of conception through the first year after birth. Postpartum Mood Disorders are the #1 complication of pregnancy and childbirth and the suffering experienced by these mothers is not necessary! These illnesses are temporary and respond well to treatment, which often includes self-care, social support, talk therapy, and medication when needed. Women experiencing anxiety and depression often say things like: "I'm supposed to be happy.why do I feel so sad?", "Why can't I snap out of it?", "I'm having thoughts that scare me." There is no need to be embarrassed if you are feeling these symptoms: Overwhelmed, anxious, angry, sad, guilty, irritable, hopeless, exhausted but can't sleep You are NOT alone. You are NOT to blame. With help, you WILL be well. Where can I find help? Medical professionals such as your OB, midwife, gynecologist, family practitioner, primary care provider, pediatrician, or mental health providers; [facility] support groups: Feelings After Birth, Breastfeeding Support Group, Baby and Me Group, and Fit 4 Two exercise classes. You have permission to ask for help. It will confirm your feelings, validate your experiences,  share/learn coping strategies, and gain support and encouragement as you heal. You are important! BRAINSTORM Make a list of local resources, including resources for mom and for partner. Identify support groups. Identify people to call late at night - include names and contact info. Talk with partner about perinatal mood and anxiety disorders. Talk with your OB, midwife, and doula about baby blues and about perinatal mood and anxiety disorders. Talk with your pediatrician about perinatal mood and anxiety disorders.   Support & Sanity Savers   What do you really need?  Basics In preparing for a new baby, many expectant parents spend hours shopping for baby clothes, decorating the nursery, and deciding which car seat to buy. Yet most don't think much about what the reality of parenting a newborn will be like, and what they need to make it through that. So, here is the advice of experienced parents. We know you'll read this, and think "they're exaggerating, I don't really need that." Just trust us   on these, OK? Plan for all of this, and if it turns out you don't need it, come back and teach us how you did it!  Must-Haves (Once baby's survival needs are met, make sure you attend to your own survival needs!) Sleep An average newborn sleeps 16-18 hours per day, over 6-7 sleep periods, rarely more than three hours at a time. It is normal and healthy for a newborn to wake throughout the night... but really hard on parents!! Naps. Prioritize sleep above any responsibilities like: cleaning house, visiting friends, running errands, etc.  Sleep whenever baby sleeps. If you can't nap, at least have restful times when baby eats. The more rest you get, the more patient you will be, the more emotionally stable, and better at solving problems.  Food You may not have realized it would be difficult to eat when you have a newborn. Yet, when we talk to countless new parents, they say things like "it may be 2:00 pm  when I realize I haven't had breakfast yet." Or "every time we sit down to dinner, baby needs to eat, and my food gets cold, so I don't bother to eat it." Finger food. Before your baby is born, stock up with one months' worth of food that: 1) you can eat with one hand while holding a baby, 2) doesn't need to be prepped, 3) is good hot or cold, 4) doesn't spoil when left out for a few hours, and 5) you like to eat. Think about: nuts, dried fruit, Clif bars, pretzels, jerky, gogurt, baby carrots, apples, bananas, crackers, cheez-n-crackers, string cheese, hot pockets or frozen burritos to microwave, garden burgers and breakfast pastries to put in the toaster, yogurt drinks, etc. Restaurant Menus. Make lists of your favorite restaurants & menu items. When family/friends want to help, you can give specific information without much thought. They can either bring you the food or send gift cards for just the right meals. Freezer Meals.  Take some time to make a few meals to put in the freezer ahead of time.  Easy to freeze meals can be anything such as soup, lasagna, chicken pie, or spaghetti sauce. Set up a Meal Schedule.  Ask friends and family to sign up to bring you meals during the first few weeks of being home. (It can be passed around at baby showers!) You have no idea how helpful this will be until you are in the throes of parenting.  www.takethemameal.com is a great website to check out. Emotional Support Know who to call when you're stressed out. Parenting a newborn is very challenging work. There are times when it totally overwhelms your normal coping abilities. EVERY NEW PARENT NEEDS TO HAVE A PLAN FOR WHO TO CALL WHEN THEY JUST CAN'T COPE ANY MORE. (And it has to be someone other than the baby's other parent!) Before your baby is born, come up with at least one person you can call for support - write their phone number down and post it on the refrigerator. Anxiety & Sadness. Baby blues are normal after  pregnancy; however, there are more severe types of anxiety & sadness which can occur and should not be ignored.  They are always treatable, but you have to take the first step by reaching out for help. [facility] offers a "Mom Talk" group which meets every Tuesday from 10 am - 11 am.  This group is for new moms who need support and connection after their babies are born.  Call [phone].    Really, Really Helpful (Plan for them! Make sure these happen often!!) Physical Support with Taking Care of Yourselves Asking friends and family. Before your baby is born, set up a schedule of people who can come and visit and help out (or ask a friend to schedule for you). Any time someone says "let me know what I can do to help," sign them up for a day. When they get there, their job is not to take care of the baby (that's your job and your joy). Their job is to take care of you!  Postpartum doulas. If you don't have anyone you can call on for support, look into postpartum doulas:  professionals at helping parents with caring for baby, caring for themselves, getting breastfeeding started, and helping with household tasks. www.padanc.org is a helpful website for learning about doulas in our area. Peer Support / Parent Groups Why: One of the greatest ideas for new parents is to be around other new parents. Parent groups give you a chance to share and listen to others who are going through the same season of life, get a sense of what is normal infant development by watching several babies learn and grow, share your stories of triumph and struggles with empathetic ears, and forgive your own mistakes when you realize all parents are learning by trial and error. Where to find: There are many places you can meet other new parents throughout our community.  [facility] offers the following classes for new moms and their little ones:  Baby and Me (Birth to Crawling) and Breastfeeding Support Group. Go to  www.conehealthybaby.com or call [phone] for more information. Time for your Relationship It's easy to get so caught up in meeting baby's immediate needs that it's hard to find time to connect with your partner, and meet the needs of your relationship. It's also easy to forget what "quality time with your partner" actually looks like. If you take your baby on a date, you'd be amazed how much of your couple time is spent feeding the baby, diapering the baby, admiring the baby, and talking about the baby. Dating: Try to take time for just the two of you. Babysitter tip: Sometimes when moms are breastfeeding a newborn, they find it hard to figure out how to schedule outings around baby's unpredictable feeding schedules. Have the babysitter come for a three hour period. When she comes over, if baby has just eaten, you can leave right away, and come back in two hours. If baby hasn't fed recently, you start the date at home. Once baby gets hungry and gets a good feeding in, you can head out for the rest of your date time. Date Nights at Home: If you can't get out, at least set aside one evening a week to prioritize your relationship: whenever baby dozes off or doesn't have any immediate needs, spend a little time focusing on each other. Potential conflicts: The main relationship conflicts that come up for new parents are: issues related to sexuality, financial stresses, a feeling of an unfair division of household tasks, and conflicts in parenting styles. The more you can work on these issues before baby arrives, the better!  Fun and Frills (Don't forget these. and don't feel guilty for indulging in them!) Everyone has something in life that is a fun little treat that they do just for themselves. It may be: reading the morning paper, or going for a daily jog, or having coffee with a friend once a week, or going   to a movie on Friday nights, or fine chocolates, or bubble baths, or curling up with a good  book. Unless you do fun things for yourself every now and then, it's hard to have the energy for fun with your baby. Whatever your "special" treats are, make sure you find a way to continue to indulge in them after your baby is born. These special moments can recharge you, and allow you to return to baby with a new joy   PERINATAL MOOD DISORDERS: MATERNAL MENTAL HEALTH FROM CONCEPTION THROUGH THE POSTPARTUM PERIOD   _________________________________________Emergency and Crisis Resources If you are an imminent risk to self or others, are experiencing intense personal distress, and/or have noticed significant changes in activities of daily living, call:  911 [facility]: [phone]  [address], [place], [place], [address] Mobile Crisis: 877-626-1772 National Suicide Hotline: 988 Or visit the following crisis centers: Local Emergency Departments [facility]: [address], [place]  [phone]. Hours: 8:30AM-5PM. Insurance Accepted: Medicaid, Medicare, and Uninsured.  [facility]:  [address], [place]  Mon-Friday 8am-3pm, [phone]                                                                                  ___________ Non-Crisis Resources To identify specific providers that are covered by your insurance, contact your insurance company or local agencies:  [facility]: 1-800-256-2452 [facility]: 888-581-9988 [employer]: 1-800-939-5911 Postpartum Support International- Warm-line: 1-800-944-4773                                                      __Outpatient Therapy and Medication Management   Providers:  [facility]: [phone] Hours: 9AM-5PM  Insurance Accepted: AARP, Aetna, BCBS, Cigna, [employer], Humana, Medicare  [facility] [phone] Hours: 8AM-5:30PM  nsurance Accepted: All insurances EXCEPT AARP, Aetna,  [employer], and Humana [facility]: [phone] Hours: 8AM-8PM Insurance Accepted: Aetna, BCBS, Cigna, [employer], Medicaid, Medicare, Uninsured [facility]: [phone] [facility]: [phone] Hours: 8:30AM-7PM Insurance Accepted: Aetna, BCBS, Medicaid, Medicare, Tricare, United Healthcare [facility]:  [phone]   Hours:9AM-5PM Insurance Accepted:  Aetna, BCBS, [facility], Medicaid, United Health Care  [facility]: [phone] Hours: 9AM-5:30PM Insurance Accepted: AARP, Aetna, BCBS, Cigna, and Medicaid, Medicare, United Health Care [facility]:  [phone] Hours: 9am-5pm Insurance Accepted: BCBS; they do not accept Medicaid/Medicare [facility]: [phone] Hours: 9am-9pm Insurance Accepted: All major insurance including Medicaid and Medicare [facility]: [phone] Hours: 9AM- 5PM Insurance Accepted: All insurances EXCEPT Medicaid and Medicare. [facility]: [phone]   ____________                                                                       Parenting Support Groups [facility]: [phone] [facility]:  [phone] Family Support Network: (support for children in the NICU and/or with special needs), [phone]   ___________                                                                 Mental Health Support Groups Mental Health Association: [phone]    _____________                                                                                  Online Resources Postpartum Support International: http://www.postpartum.net/  800-944-4PPD 2Moms Supporting Moms:  www.momssupportingmoms.net   /Emotional Wellbeing Apps and Websites Here are a few free apps meant to help you to help yourself.  To find, try searching on the internet to see if the app is offered on Apple/Android devices. If  your first choice doesn't come up on your device, the good news is that there are many choices! Play around with different apps to see which ones are helpful to you.    Calm This is an app meant to help increase calm feelings. Includes info, strategies, and tools for tracking your feelings.      Calm Harm  This app is meant to help with self-harm. Provides many 5-minute or 15-min coping strategies for doing instead of hurting yourself.       Healthy Minds Health Minds is a problem-solving tool to help deal with emotions and cope with stress you encounter wherever you are.      MindShift This app can help people cope with anxiety. Rather than trying to avoid anxiety, you can make an important shift and face it.      MY3  MY3 features a support system, safety plan and resources with the goal of offering a tool to use in a time of need.       My Life My Voice  This mood journal offers a simple solution for tracking your thoughts, feelings and moods. Animated emoticons can help identify your mood.       Relax Melodies Designed to help with sleep, on this app you can mix sounds and meditations for relaxation.      Smiling Mind Smiling Mind is meditation made easy: it's a simple tool that helps put a smile on your mind.        Stop, Breathe & Think  A friendly, simple guide for people through meditations for mindfulness and compassion.  Stop, Breathe and Think Kids Enter your current feelings and choose a "mission" to help you cope. Offers videos for certain moods instead of just sound recordings.       Team Orange The goal of this tool is to help teens change how they think, act, and react. This app helps you focus on your own good feelings and experiences.      The Virtual Hope Box The Virtual Hope Box (VHB) contains simple tools to help patients   with coping, relaxation, distraction, and positive thinking.     

## 2023-02-24 NOTE — BH Specialist Note (Signed)
Integrated Behavioral Health via Telemedicine Visit  02/24/2023 [name] [id number]  Number of [place] Clinician visits: 1- Initial Visit  Session Start time: [id number]   Session End time: [id number]  Total time in minutes: 48   Referring Provider: Hansel Feinstein, [name] Patient/Family location: Home [facility] Provider location: Center for [place] [facility]  All persons participating in visit: [name] and [place]   Types of Service: Individual psychotherapy and Video visit  I connected with [name] and/or [place]  via  Telephone or Video Enabled Telemedicine Application  (Video is Caregility application) and verified that I am speaking with the correct person using two identifiers. Discussed confidentiality: Yes   I discussed the limitations of telemedicine and the availability of in person appointments.  Discussed there is a possibility of technology failure and discussed alternative modes of communication if that failure occurs.  I discussed that engaging in this telemedicine visit, they consent to the provision of behavioral healthcare and the services will be billed under their insurance.  Patient and/or legal guardian expressed understanding and consented to Telemedicine visit: Yes   Presenting Concerns: Patient and/or family reports the following symptoms/concerns: Noticing more anxiety this pregnancy, along with panic in "high traffic areas" (shopping, etc.); past traumatic birth experience (baby's heart rate dropped, was told she "didn't have a choice, have to have emergency cesarean in the next eight minutes and will have to put you to sleep" and unable to have FOB with her. Pt open to implementing self-coping strategy to help manage anxiety.  Duration of problem: Increasing over time in current pregnancy; Severity of problem:  moderately severe  Patient and/or Family's  Strengths/Protective Factors: Social connections, Concrete supports in place (healthy food, safe environments, etc.), and Sense of purpose  Goals Addressed: Patient will:  Reduce symptoms of: anxiety   Increase knowledge and/or ability of: self-management skills   Demonstrate ability to: Increase healthy adjustment to current life circumstances  Progress towards Goals: Ongoing  Interventions: Interventions utilized:  Mindfulness or Psychologist, educational, Psychoeducation and/or Health Education, and Link to [employer] Standardized Assessments completed: Not Needed  Patient and/or Family Response: Patient agrees with treatment plan.   Assessment: Patient currently experiencing Anxiety disorder, unspecified.   Patient may benefit from psychoeducation and brief therapeutic interventions regarding coping with symptoms of anxiety.  Plan: Follow up with behavioral health clinician on :  Two weeks Behavioral recommendations:  -CALM relaxation breathing exercise twice daily (morning; at bedtime with sleep sounds); as needed throughout the day. -Read through information on After Visit Summary; use as needed -Discuss options for as-needed anxiety medication at upcoming medical appointment -Continue plan for grandma to stay postpartum up to 4 weeks Referral(s): [place] (In Clinic) and Community Resources:  Postpartum planner/support  I discussed the assessment and treatment plan with the patient and/or parent/guardian. They were provided an opportunity to ask questions and all were answered. They agreed with the plan and demonstrated an understanding of the instructions.   They were advised to call back or seek an in-person evaluation if the symptoms worsen or if the condition fails to improve as anticipated.  Caroleen Hamman Adonias Demore, LCSW     02/13/2023   10:00 AM 12/24/2022   11:40 AM 08/31/2019    8:30 AM  Depression screen PHQ 2/9  Decreased Interest 0 3  2  Down, Depressed, Hopeless 0 1 3  PHQ - 2 Score 0 4 5  Altered sleeping  0 2  Tired, decreased energy  3 2  Change in appetite  0 0  Feeling bad or failure about yourself   0 1  Trouble concentrating  1 0  Moving slowly or fidgety/restless  0 0  Suicidal thoughts  0 0  PHQ-9 Score  8 10      12/24/2022   11:41 AM 08/31/2019    8:30 AM  GAD 7 : Generalized Anxiety Score  Nervous, Anxious, on Edge 3 3  Control/stop worrying 3 3  Worry too much - different things 3 3  Trouble relaxing 2 2  Restless 0 0  Easily annoyed or irritable 0 1  Afraid - awful might happen 2 3  Total GAD 7 Score 13 15

## 2023-02-28 NOTE — BH Specialist Note (Signed)
Integrated Behavioral Health via Telemedicine Visit  02/28/2023 [name] [id number]  Number of [place] Clinician visits: 1- Initial Visit  Session Start time: [id number]   Session End time: [id number]  Total time in minutes: 48   Referring Provider: Hansel Feinstein, [name] Patient/Family location: Home [facility] Provider location: Center for [place] at [facility]  All persons participating in visit: [name] and [place]   Types of Service: Individual psychotherapy and Video visit  I connected with [name] and/or [place]  via  Telephone or Video Enabled Telemedicine Application  (Video is Caregility application) and verified that I am speaking with the correct person using two identifiers. Discussed confidentiality: Yes   I discussed the limitations of telemedicine and the availability of in person appointments.  Discussed there is a possibility of technology failure and discussed alternative modes of communication if that failure occurs.  I discussed that engaging in this telemedicine visit, they consent to the provision of behavioral healthcare and the services will be billed under their insurance.  Patient and/or legal guardian expressed understanding and consented to Telemedicine visit: Yes   Presenting Concerns: Patient and/or family reports the following symptoms/concerns: Increasing anxiety while alone in public spaces, with panic attack yesterday while grocery shopping, along with pain in ribcage (baby's position) making sleep difficult.  Duration of problem: Increasing current pregnancy; Severity of problem: moderate  Patient and/or Family's Strengths/Protective Factors: Social connections, Concrete supports in place (healthy food, safe environments, etc.), and Sense of purpose  Goals Addressed: Patient will:  Reduce symptoms of: anxiety   Increase knowledge and/or ability of:  self-management skills   Demonstrate ability to: Increase healthy adjustment to current life circumstances  Progress towards Goals: Ongoing  Interventions: Interventions utilized:  Mindfulness or Relaxation Training Standardized Assessments completed: Not Needed  Patient and/or Family Response: Patient agrees with treatment plan.   Assessment: Patient currently experiencing Anxiety disorder, unspecified.   Patient may benefit from continued therapeutic interventions.  Plan: Follow up with behavioral health clinician on : Two weeks Behavioral recommendations:  -Continue using relaxation breathing exercise daily as needed; consider adding grounding strategy as discussed -Continue plans for upcoming Easter weekend; ask sister to come with you for extra support -Continue plan for grandmother to stay in the home for up to a month postpartum -Continue plan to obtain a body pillow today; consider sleeping sitting partially reclined with body pillow for extra support if needed the next few weeks until labor Referral(s): [place] (In Clinic)  I discussed the assessment and treatment plan with the patient and/or parent/guardian. They were provided an opportunity to ask questions and all were answered. They agreed with the plan and demonstrated an understanding of the instructions.   They were advised to call back or seek an in-person evaluation if the symptoms worsen or if the condition fails to improve as anticipated.  Caroleen Hamman Kourtland Coopman, LCSW     03/04/2023   11:19 AM 02/13/2023   10:00 AM 12/24/2022   11:40 AM 08/31/2019    8:30 AM  Depression screen PHQ 2/9  Decreased Interest 1 0 3 2  Down, Depressed, Hopeless 0 0 1 3  PHQ - 2 Score 1 0 4 5  Altered sleeping 2  0 2  Tired, decreased energy 3  3 2   Change in appetite 0  0 0  Feeling bad or failure about yourself  0  0 1  Trouble concentrating 1  1 0  Moving slowly or fidgety/restless 0  0 0  Suicidal  thoughts 0  0 0  PHQ-9 Score 7  8 10       03/04/2023   11:20 AM 12/24/2022   11:41 AM 08/31/2019    8:30 AM  GAD 7 : Generalized Anxiety Score  Nervous, Anxious, on Edge 2 3 3   Control/stop worrying 3 3 3   Worry too much - different things 3 3 3   Trouble relaxing 2 2 2   Restless 1 0 0  Easily annoyed or irritable 1 0 1  Afraid - awful might happen 3 2 3   Total GAD 7 Score 15 13 15

## 2023-03-04 ENCOUNTER — Encounter: Payer: Self-pay | Admitting: General Practice

## 2023-03-04 ENCOUNTER — Other Ambulatory Visit (HOSPITAL_COMMUNITY)
Admission: RE | Admit: 2023-03-04 | Discharge: 2023-03-04 | Disposition: A | Discharge status: Home / Self Care | Payer: Medicaid Other | Source: Ambulatory Visit | Attending: Advanced Practice Midwife | Admitting: Advanced Practice Midwife

## 2023-03-04 ENCOUNTER — Encounter: Payer: Self-pay | Admitting: Advanced Practice Midwife

## 2023-03-04 ENCOUNTER — Ambulatory Visit (INDEPENDENT_AMBULATORY_CARE_PROVIDER_SITE_OTHER): Payer: Medicaid Other | Admitting: Advanced Practice Midwife

## 2023-03-04 VITALS — BP 120/77 | HR 122 | Wt 202.0 lb

## 2023-03-04 DIAGNOSIS — O24419 Gestational diabetes mellitus in pregnancy, unspecified control: Secondary | ICD-10-CM

## 2023-03-04 DIAGNOSIS — Z3A36 36 weeks gestation of pregnancy: Secondary | ICD-10-CM

## 2023-03-04 DIAGNOSIS — O26879 Cervical shortening, unspecified trimester: Secondary | ICD-10-CM

## 2023-03-04 DIAGNOSIS — Z1339 Encounter for screening examination for other mental health and behavioral disorders: Secondary | ICD-10-CM

## 2023-03-04 DIAGNOSIS — O26873 Cervical shortening, third trimester: Secondary | ICD-10-CM

## 2023-03-04 DIAGNOSIS — N368 Other specified disorders of urethra: Secondary | ICD-10-CM

## 2023-03-04 DIAGNOSIS — Z98891 History of uterine scar from previous surgery: Secondary | ICD-10-CM

## 2023-03-04 DIAGNOSIS — Z3480 Encounter for supervision of other normal pregnancy, unspecified trimester: Secondary | ICD-10-CM

## 2023-03-04 NOTE — Progress Notes (Signed)
   PRENATAL VISIT NOTE  Subjective:  Christina [name] is a 27 y.o. G3P1011 at [redacted]w[redacted]d being seen today for ongoing prenatal care.  She is currently monitored for the following issues for this high-risk pregnancy and has Supervision of other normal pregnancy, antepartum; Anxiety; Eczema, dyshidrotic; Short cervical length during pregnancy; Skene's duct cyst; History of cesarean delivery; History of prior pregnancy with IUGR newborn; SGA (small for gestational age); Gestational diabetes mellitus; and Preterm labor on their problem list.  Patient reports no complaints.  Contractions: Irritability. Vag. Bleeding: None.  Movement: Present. Denies leaking of fluid.   The following portions of the patient's history were reviewed and updated as appropriate: allergies, current medications, past family history, past medical history, past social history, past surgical history and problem list.   Objective:   Vitals:   03/04/23 1113  BP: 120/77  Pulse: (!) 122  Weight: 202 lb (91.6 kg)    Fetal Status: Fetal Heart Rate (bpm): 145 Fundal Height: 36 cm Movement: Present     General:  Alert, oriented and cooperative. Patient is in no acute distress.  Skin: Skin is warm and dry. No rash noted.   Cardiovascular: Normal heart rate noted  Respiratory: Normal respiratory effort, no problems with respiration noted  Abdomen: Soft, gravid, appropriate for gestational age.  Pain/Pressure: Present     Pelvic: Cervical exam performed in the presence of a chaperone     Kathrene Alu RN.  Cervix is 1cm/90%/-1/vertex   Extremities: Normal range of motion.  Edema: None  Mental Status: Normal mood and affect. Normal behavior. Normal judgment and thought content.   Assessment and Plan:  Pregnancy: G3P1011 at [redacted]w[redacted]d 1. Short cervix, antepartum     At term now  2. [redacted] weeks gestation of pregnancy  - Culture, beta strep (group b only) - GC/Chlamydia probe amp ([place])not at [facility]  3. Gestational diabetes  mellitus (GDM), antepartum, gestational diabetes method of control unspecified     States all BS within goals  - Culture, beta strep (group b only) - GC/Chlamydia probe amp ([place])not at [facility]  4. History of cesarean delivery    Plans TOLAC  5. Supervision of other normal pregnancy, antepartum   6. Skene's duct cyst     Wants it removed/repaired.  Will schedule consult with MD to discuss dong this postpartum   Patient would like it done as soon as possible  Preterm labor symptoms and general obstetric precautions including but not limited to vaginal bleeding, contractions, leaking of fluid and fetal movement were reviewed in detail with the patient. Please refer to After Visit Summary for other counseling recommendations.   Return in about 1 week (around 03/11/2023) for [facility].  Future Appointments  Date Time Provider [place]  03/10/2023  2:45 PM [facility] NURSE [facility]  03/10/2023  3:00 PM [facility] US1 [facility]  03/12/2023  3:15 PM [place] [facility]  03/20/2023  1:10 PM Truett Mainland, DO [facility] None  03/25/2023 10:55 AM Jorje Guild, NP [facility] None  04/01/2023 10:55 AM Gavin Pound, [name] [facility] None    Hansel Feinstein, [name]

## 2023-03-04 NOTE — Progress Notes (Signed)
Patient requests to be checked today. Landen Knoedler RN 

## 2023-03-05 LAB — GC/CHLAMYDIA PROBE AMP (~~LOC~~) NOT AT ARMC
Chlamydia: NEGATIVE
Comment: NEGATIVE
Comment: NORMAL
Neisseria Gonorrhea: NEGATIVE

## 2023-03-07 ENCOUNTER — Encounter: Payer: Self-pay | Admitting: Advanced Practice Midwife

## 2023-03-07 LAB — CULTURE, BETA STREP (GROUP B ONLY): Strep Gp B Culture: POSITIVE — AB

## 2023-03-10 ENCOUNTER — Ambulatory Visit: Payer: Medicaid Other | Attending: Obstetrics and Gynecology

## 2023-03-10 ENCOUNTER — Ambulatory Visit: Payer: Medicaid Other | Admitting: *Deleted

## 2023-03-10 ENCOUNTER — Encounter: Payer: Self-pay | Admitting: *Deleted

## 2023-03-10 VITALS — BP 124/75 | HR 107

## 2023-03-10 DIAGNOSIS — F419 Anxiety disorder, unspecified: Secondary | ICD-10-CM | POA: Insufficient documentation

## 2023-03-10 DIAGNOSIS — O34219 Maternal care for unspecified type scar from previous cesarean delivery: Secondary | ICD-10-CM | POA: Insufficient documentation

## 2023-03-10 DIAGNOSIS — O26873 Cervical shortening, third trimester: Secondary | ICD-10-CM | POA: Diagnosis not present

## 2023-03-10 DIAGNOSIS — Z3480 Encounter for supervision of other normal pregnancy, unspecified trimester: Secondary | ICD-10-CM | POA: Insufficient documentation

## 2023-03-10 DIAGNOSIS — O99343 Other mental disorders complicating pregnancy, third trimester: Secondary | ICD-10-CM | POA: Diagnosis not present

## 2023-03-10 DIAGNOSIS — Z3A37 37 weeks gestation of pregnancy: Secondary | ICD-10-CM | POA: Insufficient documentation

## 2023-03-10 DIAGNOSIS — O09293 Supervision of pregnancy with other poor reproductive or obstetric history, third trimester: Secondary | ICD-10-CM | POA: Insufficient documentation

## 2023-03-10 DIAGNOSIS — O24410 Gestational diabetes mellitus in pregnancy, diet controlled: Secondary | ICD-10-CM | POA: Insufficient documentation

## 2023-03-10 DIAGNOSIS — O4700 False labor before 37 completed weeks of gestation, unspecified trimester: Secondary | ICD-10-CM | POA: Insufficient documentation

## 2023-03-10 DIAGNOSIS — O99213 Obesity complicating pregnancy, third trimester: Secondary | ICD-10-CM | POA: Insufficient documentation

## 2023-03-10 DIAGNOSIS — O471 False labor at or after 37 completed weeks of gestation: Secondary | ICD-10-CM | POA: Diagnosis not present

## 2023-03-10 DIAGNOSIS — Z8759 Personal history of other complications of pregnancy, childbirth and the puerperium: Secondary | ICD-10-CM | POA: Insufficient documentation

## 2023-03-10 DIAGNOSIS — O26879 Cervical shortening, unspecified trimester: Secondary | ICD-10-CM | POA: Diagnosis not present

## 2023-03-10 DIAGNOSIS — E669 Obesity, unspecified: Secondary | ICD-10-CM

## 2023-03-12 ENCOUNTER — Ambulatory Visit (INDEPENDENT_AMBULATORY_CARE_PROVIDER_SITE_OTHER): Payer: Medicaid Other | Admitting: Clinical

## 2023-03-12 DIAGNOSIS — F419 Anxiety disorder, unspecified: Secondary | ICD-10-CM | POA: Diagnosis not present

## 2023-03-12 NOTE — Patient Instructions (Signed)
Center for [facility] at [facility] for Women [place], [place] [address] [phone] (main office) [phone] ([place] office)  Grounding Strategy 5 things I see 4 things I hear 3 things I feel 2 things I smell 1 thing I taste

## 2023-03-18 ENCOUNTER — Encounter (HOSPITAL_COMMUNITY): Payer: Self-pay | Admitting: Obstetrics and Gynecology

## 2023-03-18 ENCOUNTER — Inpatient Hospital Stay (HOSPITAL_COMMUNITY)
Admission: AD | Admit: 2023-03-18 | Discharge: 2023-03-18 | Disposition: A | Discharge status: Home / Self Care | Payer: Medicaid Other | Attending: Obstetrics and Gynecology | Admitting: Obstetrics and Gynecology

## 2023-03-18 DIAGNOSIS — O479 False labor, unspecified: Secondary | ICD-10-CM | POA: Diagnosis not present

## 2023-03-18 DIAGNOSIS — O471 False labor at or after 37 completed weeks of gestation: Secondary | ICD-10-CM | POA: Diagnosis present

## 2023-03-18 DIAGNOSIS — Z3A38 38 weeks gestation of pregnancy: Secondary | ICD-10-CM | POA: Diagnosis not present

## 2023-03-18 HISTORY — DX: Gestational diabetes mellitus in pregnancy, unspecified control: O24.419

## 2023-03-18 NOTE — MAU Provider Note (Signed)
S: Ms. Christina [name] is a 27 y.o. G3P1011 at [redacted]w[redacted]d  who presents to MAU today complaining contractions q 5-10 minutes since noon. She denies vaginal bleeding. She denies LOF. She reports normal fetal movement.    O: BP 131/75 (BP Location: Right Arm)   Pulse (!) 103   Temp 98.9 F (37.2 C) (Oral)   Resp 17   LMP 06/23/2022   SpO2 97%  GENERAL: Well-developed, well-nourished female in no acute distress.  HEAD: Normocephalic, atraumatic.  CHEST: Normal effort of breathing, regular heart rate ABDOMEN: Soft, nontender, gravid  Cervical exam:  Dilation: 1.5 Effacement (%): 90 Station: -2 Presentation: Vertex Exam by:: Huston Foley RN   Fetal Monitoring: Baseline: 130 bpm Variability: moderate Accelerations: 15x15 Decelerations: none Contractions: none   A: SIUP at [redacted]w[redacted]d  False labor  P: Precautions given to return Follow up with primary OB  Shelda Pal, DO 03/18/2023 7:43 PM

## 2023-03-18 NOTE — MAU Note (Signed)
...  Christina [name] is a 27 y.o. at [redacted]w[redacted]d here in MAU reporting: CTX since noon. She reports she took a shower around noon and it relaxed her a bit but she reports the CTX have started to increase again. She reports they are every 5-10 minutes. Denies VB or LOF. +FM.   She is hoping she is not in labor.   Hx short cervix in this pregnancy. GDM. TOLAC. 190/-1 on last SVE on 03/04/2023.  Onset of complaint: Noon Pain score: 6/10 lower abdomen  FHT: 153 initial external Lab orders placed from triage:  MAU Labor Eval

## 2023-03-20 ENCOUNTER — Ambulatory Visit (INDEPENDENT_AMBULATORY_CARE_PROVIDER_SITE_OTHER): Payer: Medicaid Other | Admitting: Family Medicine

## 2023-03-20 VITALS — BP 114/64 | HR 90 | Wt 209.0 lb

## 2023-03-20 DIAGNOSIS — O24419 Gestational diabetes mellitus in pregnancy, unspecified control: Secondary | ICD-10-CM

## 2023-03-20 DIAGNOSIS — O26873 Cervical shortening, third trimester: Secondary | ICD-10-CM

## 2023-03-20 DIAGNOSIS — Z98891 History of uterine scar from previous surgery: Secondary | ICD-10-CM

## 2023-03-20 DIAGNOSIS — O26879 Cervical shortening, unspecified trimester: Secondary | ICD-10-CM

## 2023-03-20 DIAGNOSIS — Z3A38 38 weeks gestation of pregnancy: Secondary | ICD-10-CM

## 2023-03-20 DIAGNOSIS — N368 Other specified disorders of urethra: Secondary | ICD-10-CM

## 2023-03-20 DIAGNOSIS — O34211 Maternal care for low transverse scar from previous cesarean delivery: Secondary | ICD-10-CM

## 2023-03-20 DIAGNOSIS — Z3480 Encounter for supervision of other normal pregnancy, unspecified trimester: Secondary | ICD-10-CM

## 2023-03-20 NOTE — Progress Notes (Signed)
   PRENATAL VISIT NOTE  Subjective:  Christina [name] is a 27 y.o. G3P1011 at [redacted]w[redacted]d being seen today for ongoing prenatal care.  She is currently monitored for the following issues for this high-risk pregnancy and has Supervision of other normal pregnancy, antepartum; Anxiety; Eczema, dyshidrotic; Short cervical length during pregnancy; Skene's duct cyst; History of cesarean delivery; History of prior pregnancy with IUGR newborn; SGA (small for gestational age); Gestational diabetes mellitus; Preterm labor; and False labor on their problem list.  Patient reports occasional contractions.  Contractions: Irritability. Vag. Bleeding: None.  Movement: Present. Denies leaking of fluid.   The following portions of the patient's history were reviewed and updated as appropriate: allergies, current medications, past family history, past medical history, past social history, past surgical history and problem list.   Objective:   Vitals:   03/20/23 0928  BP: 114/64  Pulse: 90  Weight: 209 lb (94.8 kg)    Fetal Status: Fetal Heart Rate (bpm): 139   Movement: Present     General:  Alert, oriented and cooperative. Patient is in no acute distress.  Skin: Skin is warm and dry. No rash noted.   Cardiovascular: Normal heart rate noted  Respiratory: Normal respiratory effort, no problems with respiration noted  Abdomen: Soft, gravid, appropriate for gestational age.  Pain/Pressure: Present     Pelvic: Cervical exam deferred        Extremities: Normal range of motion.  Edema: None  Mental Status: Normal mood and affect. Normal behavior. Normal judgment and thought content.   Assessment and Plan:  Pregnancy: G3P1011 at [redacted]w[redacted]d 1. [redacted] weeks gestation of pregnancy  2. Supervision of other normal pregnancy, antepartum FHT and FH normal  3. History of cesarean delivery Desires TOLAC  4. Short cervix, antepartum  5. Gestational diabetes mellitus (GDM), antepartum, gestational diabetes method of control  unspecified Reports blood sugars as controlled. EFW 15% Will schedule for induction on EDC.  6. Skene's duct cyst Not bothering her. No changes currently Discussed referral to urogyn or urology following delivery. It should not pose a problem with delivery.  Term labor symptoms and general obstetric precautions including but not limited to vaginal bleeding, contractions, leaking of fluid and fetal movement were reviewed in detail with the patient. Please refer to After Visit Summary for other counseling recommendations.   No follow-ups on file.  Future Appointments  Date Time Provider Department Center  03/25/2023 10:55 AM Judeth Horn, NP [facility] None  04/01/2023 10:55 AM Gerrit Heck, [name] [facility] None  04/09/2023  1:15 PM [facility] HEALTH CLINICIAN [facility]    Levie Heritage, DO

## 2023-03-24 ENCOUNTER — Inpatient Hospital Stay (HOSPITAL_COMMUNITY): Payer: Medicaid Other | Admitting: Anesthesiology

## 2023-03-24 ENCOUNTER — Inpatient Hospital Stay (HOSPITAL_COMMUNITY)
Admission: AD | Admit: 2023-03-24 | Discharge: 2023-03-26 | DRG: 807 | Disposition: A | Discharge status: Home / Self Care | Payer: Medicaid Other | Attending: Family Medicine | Admitting: Family Medicine

## 2023-03-24 DIAGNOSIS — Z3A39 39 weeks gestation of pregnancy: Secondary | ICD-10-CM

## 2023-03-24 DIAGNOSIS — F419 Anxiety disorder, unspecified: Secondary | ICD-10-CM | POA: Diagnosis present

## 2023-03-24 DIAGNOSIS — Z30430 Encounter for insertion of intrauterine contraceptive device: Secondary | ICD-10-CM | POA: Diagnosis not present

## 2023-03-24 DIAGNOSIS — O99820 Streptococcus B carrier state complicating pregnancy: Secondary | ICD-10-CM | POA: Diagnosis not present

## 2023-03-24 DIAGNOSIS — Z30014 Encounter for initial prescription of intrauterine contraceptive device: Secondary | ICD-10-CM | POA: Diagnosis not present

## 2023-03-24 DIAGNOSIS — O4202 Full-term premature rupture of membranes, onset of labor within 24 hours of rupture: Secondary | ICD-10-CM | POA: Diagnosis not present

## 2023-03-24 DIAGNOSIS — O99892 Other specified diseases and conditions complicating childbirth: Secondary | ICD-10-CM | POA: Diagnosis not present

## 2023-03-24 DIAGNOSIS — O24420 Gestational diabetes mellitus in childbirth, diet controlled: Secondary | ICD-10-CM | POA: Diagnosis present

## 2023-03-24 DIAGNOSIS — O99344 Other mental disorders complicating childbirth: Secondary | ICD-10-CM | POA: Diagnosis present

## 2023-03-24 DIAGNOSIS — M79651 Pain in right thigh: Secondary | ICD-10-CM | POA: Diagnosis not present

## 2023-03-24 DIAGNOSIS — O34211 Maternal care for low transverse scar from previous cesarean delivery: Secondary | ICD-10-CM

## 2023-03-24 DIAGNOSIS — M79604 Pain in right leg: Secondary | ICD-10-CM | POA: Diagnosis not present

## 2023-03-24 DIAGNOSIS — O34219 Maternal care for unspecified type scar from previous cesarean delivery: Secondary | ICD-10-CM | POA: Diagnosis present

## 2023-03-24 DIAGNOSIS — Z91040 Latex allergy status: Secondary | ICD-10-CM

## 2023-03-24 DIAGNOSIS — O99824 Streptococcus B carrier state complicating childbirth: Secondary | ICD-10-CM | POA: Diagnosis present

## 2023-03-24 DIAGNOSIS — O26893 Other specified pregnancy related conditions, third trimester: Secondary | ICD-10-CM | POA: Diagnosis present

## 2023-03-24 LAB — GLUCOSE, CAPILLARY
Glucose-Capillary: 138 mg/dL — ABNORMAL HIGH (ref 70–99)
Glucose-Capillary: 124 mg/dL — ABNORMAL HIGH (ref 70–99)

## 2023-03-24 LAB — COMPREHENSIVE METABOLIC PANEL
ALT: 16 U/L (ref 0–44)
AST: 16 U/L (ref 15–41)
Albumin: 3 g/dL — ABNORMAL LOW (ref 3.5–5.0)
Alkaline Phosphatase: 206 U/L — ABNORMAL HIGH (ref 38–126)
Anion gap: 16 — ABNORMAL HIGH (ref 5–15)
BUN: 8 mg/dL (ref 6–20)
CO2: 16 mmol/L — ABNORMAL LOW (ref 22–32)
Calcium: 9.1 mg/dL (ref 8.9–10.3)
Chloride: 100 mmol/L (ref 98–111)
Creatinine, Ser: 0.65 mg/dL (ref 0.44–1.00)
GFR, Estimated: >60 mL/min (ref >60)
Glucose, Bld: 134 mg/dL — ABNORMAL HIGH (ref 70–99)
Potassium: 3.4 mmol/L — ABNORMAL LOW (ref 3.5–5.1)
Sodium: 132 mmol/L — ABNORMAL LOW (ref 135–145)
Total Bilirubin: 0.2 mg/dL — ABNORMAL LOW (ref 0.3–1.2)
Total Protein: 6.7 g/dL (ref 6.5–8.1)

## 2023-03-24 LAB — CBC
HCT: 48 % — ABNORMAL HIGH (ref 36.0–46.0)
Hemoglobin: 15.6 g/dL — ABNORMAL HIGH (ref 12.0–15.0)
MCH: 27.8 pg (ref 26.0–34.0)
MCHC: 32.5 g/dL (ref 30.0–36.0)
MCV: 85.4 fL (ref 80.0–100.0)
Platelets: 190 10*3/uL (ref 150–400)
RBC: 5.62 MIL/uL — ABNORMAL HIGH (ref 3.87–5.11)
RDW: 15.6 % — ABNORMAL HIGH (ref 11.5–15.5)
WBC: 5.6 10*3/uL (ref 4.0–10.5)
nRBC: 0 % (ref 0.0–0.2)

## 2023-03-24 LAB — RPR: RPR Ser Ql: NONREACTIVE

## 2023-03-24 LAB — TYPE AND SCREEN
ABO/RH(D): O POS
Antibody Screen: NEGATIVE

## 2023-03-24 LAB — PROTEIN / CREATININE RATIO, URINE
Creatinine, Urine: 12 mg/dL
Total Protein, Urine: <6 mg/dL

## 2023-03-24 MED ORDER — LIDOCAINE HCL (PF) 1 % IJ SOLN: 30.0000 mL | INTRAMUSCULAR | Status: DC | PRN

## 2023-03-24 MED ORDER — LEVONORGESTREL 20 MCG/DAY IU IUD
1.0000 | INTRAUTERINE_SYSTEM | Freq: Once | INTRAUTERINE | Status: AC
  Administered 2023-03-24: 1 via INTRAUTERINE
  Filled 2023-03-24: qty 1

## 2023-03-24 MED ORDER — DIPHENHYDRAMINE HCL 50 MG/ML IJ SOLN: 12.5000 mg | INTRAMUSCULAR | Status: DC | PRN

## 2023-03-24 MED ORDER — ONDANSETRON HCL 4 MG PO TABS: 4.0000 mg | ORAL_TABLET | ORAL | Status: DC | PRN

## 2023-03-24 MED ORDER — PENICILLIN G POT IN DEXTROSE 60000 UNIT/ML IV SOLN
3.0000 10*6.[IU] | INTRAVENOUS | Status: DC
  Administered 2023-03-24: 3 10*6.[IU] via INTRAVENOUS
  Filled 2023-03-24: qty 50

## 2023-03-24 MED ORDER — PHENYLEPHRINE 80 MCG/ML (10ML) SYRINGE FOR IV PUSH (FOR BLOOD PRESSURE SUPPORT): 80.0000 ug | PREFILLED_SYRINGE | INTRAVENOUS | Status: DC | PRN

## 2023-03-24 MED ORDER — OXYCODONE HCL 5 MG PO TABS
5.0000 mg | ORAL_TABLET | ORAL | Status: DC | PRN
  Administered 2023-03-25 – 2023-03-26 (×2): 5 mg via ORAL
  Filled 2023-03-24 (×2): qty 1

## 2023-03-24 MED ORDER — TERBUTALINE SULFATE 1 MG/ML IJ SOLN: 0.2500 mg | Freq: Once | INTRAMUSCULAR | Status: DC | PRN

## 2023-03-24 MED ORDER — WITCH HAZEL-GLYCERIN EX PADS: 1.0000 | MEDICATED_PAD | CUTANEOUS | Status: DC | PRN

## 2023-03-24 MED ORDER — PRENATAL MULTIVITAMIN CH
1.0000 | ORAL_TABLET | Freq: Every day | ORAL | Status: DC
  Administered 2023-03-25 – 2023-03-26 (×2): 1 via ORAL
  Filled 2023-03-24 (×2): qty 1

## 2023-03-24 MED ORDER — FENTANYL CITRATE (PF) 100 MCG/2ML IJ SOLN: 100.0000 ug | INTRAMUSCULAR | Status: DC | PRN

## 2023-03-24 MED ORDER — FAMOTIDINE IN NACL 20-0.9 MG/50ML-% IV SOLN
20.0000 mg | Freq: Once | INTRAVENOUS | Status: AC
  Administered 2023-03-24: 20 mg via INTRAVENOUS
  Filled 2023-03-24: qty 50

## 2023-03-24 MED ORDER — LACTATED RINGERS IV SOLN: 500.0000 mL | INTRAVENOUS | Status: DC | PRN

## 2023-03-24 MED ORDER — ZOLPIDEM TARTRATE 5 MG PO TABS: 5.0000 mg | ORAL_TABLET | Freq: Every evening | ORAL | Status: DC | PRN

## 2023-03-24 MED ORDER — LACTATED RINGERS IV SOLN: INTRAVENOUS | Status: DC

## 2023-03-24 MED ORDER — OXYCODONE HCL 5 MG PO TABS: 10.0000 mg | ORAL_TABLET | ORAL | Status: DC | PRN

## 2023-03-24 MED ORDER — OXYCODONE-ACETAMINOPHEN 5-325 MG PO TABS: 1.0000 | ORAL_TABLET | ORAL | Status: DC | PRN

## 2023-03-24 MED ORDER — ACETAMINOPHEN 325 MG PO TABS
650.0000 mg | ORAL_TABLET | ORAL | Status: DC | PRN
  Administered 2023-03-24: 650 mg via ORAL
  Filled 2023-03-24: qty 2

## 2023-03-24 MED ORDER — SIMETHICONE 80 MG PO CHEW: 80.0000 mg | CHEWABLE_TABLET | ORAL | Status: DC | PRN

## 2023-03-24 MED ORDER — OXYTOCIN BOLUS FROM INFUSION
333.0000 mL | Freq: Once | INTRAVENOUS | Status: AC
  Administered 2023-03-24: 333 mL via INTRAVENOUS

## 2023-03-24 MED ORDER — BENZOCAINE-MENTHOL 20-0.5 % EX AERO
1.0000 | INHALATION_SPRAY | CUTANEOUS | Status: DC | PRN
  Administered 2023-03-24: 1 via TOPICAL
  Filled 2023-03-24: qty 56

## 2023-03-24 MED ORDER — DIBUCAINE (PERIANAL) 1 % EX OINT: 1.0000 | TOPICAL_OINTMENT | CUTANEOUS | Status: DC | PRN

## 2023-03-24 MED ORDER — COCONUT OIL OIL: 1.0000 | TOPICAL_OIL | Status: DC | PRN

## 2023-03-24 MED ORDER — LACTATED RINGERS IV SOLN
500.0000 mL | Freq: Once | INTRAVENOUS | Status: AC
  Administered 2023-03-24: 500 mL via INTRAVENOUS

## 2023-03-24 MED ORDER — SENNOSIDES-DOCUSATE SODIUM 8.6-50 MG PO TABS
2.0000 | ORAL_TABLET | Freq: Every day | ORAL | Status: DC
  Administered 2023-03-25 – 2023-03-26 (×2): 2 via ORAL
  Filled 2023-03-24 (×2): qty 2

## 2023-03-24 MED ORDER — PHENYLEPHRINE 80 MCG/ML (10ML) SYRINGE FOR IV PUSH (FOR BLOOD PRESSURE SUPPORT)
80.0000 ug | PREFILLED_SYRINGE | INTRAVENOUS | Status: DC | PRN
  Filled 2023-03-24: qty 10

## 2023-03-24 MED ORDER — ACETAMINOPHEN 325 MG PO TABS: 650.0000 mg | ORAL_TABLET | ORAL | Status: DC | PRN

## 2023-03-24 MED ORDER — SODIUM CHLORIDE 0.9 % IV SOLN
5.0000 10*6.[IU] | Freq: Once | INTRAVENOUS | Status: AC
  Administered 2023-03-24: 5 10*6.[IU] via INTRAVENOUS
  Filled 2023-03-24: qty 5

## 2023-03-24 MED ORDER — OXYCODONE-ACETAMINOPHEN 5-325 MG PO TABS: 2.0000 | ORAL_TABLET | ORAL | Status: DC | PRN

## 2023-03-24 MED ORDER — OXYTOCIN-SODIUM CHLORIDE 30-0.9 UT/500ML-% IV SOLN
1.0000 m[IU]/min | INTRAVENOUS | Status: DC
  Administered 2023-03-24: 2 m[IU]/min via INTRAVENOUS

## 2023-03-24 MED ORDER — ONDANSETRON HCL 4 MG/2ML IJ SOLN
4.0000 mg | Freq: Four times a day (QID) | INTRAMUSCULAR | Status: DC | PRN
  Administered 2023-03-24: 4 mg via INTRAVENOUS
  Filled 2023-03-24: qty 2

## 2023-03-24 MED ORDER — EPHEDRINE 5 MG/ML INJ: 10.0000 mg | INTRAVENOUS | Status: DC | PRN

## 2023-03-24 MED ORDER — OXYTOCIN-SODIUM CHLORIDE 30-0.9 UT/500ML-% IV SOLN
2.5000 [IU]/h | INTRAVENOUS | Status: DC
  Administered 2023-03-24: 2.5 [IU]/h via INTRAVENOUS
  Filled 2023-03-24: qty 500

## 2023-03-24 MED ORDER — LIDOCAINE HCL (PF) 1 % IJ SOLN
INTRAMUSCULAR | Status: DC | PRN
  Administered 2023-03-24: 5 mL via EPIDURAL

## 2023-03-24 MED ORDER — DIPHENHYDRAMINE HCL 25 MG PO CAPS: 25.0000 mg | ORAL_CAPSULE | Freq: Four times a day (QID) | ORAL | Status: DC | PRN

## 2023-03-24 MED ORDER — TETANUS-DIPHTH-ACELL PERTUSSIS 5-2.5-18.5 LF-MCG/0.5 IM SUSY: 0.5000 mL | PREFILLED_SYRINGE | Freq: Once | INTRAMUSCULAR | Status: DC

## 2023-03-24 MED ORDER — FENTANYL-BUPIVACAINE-NACL 0.5-0.125-0.9 MG/250ML-% EP SOLN
12.0000 mL/h | EPIDURAL | Status: DC | PRN
  Filled 2023-03-24: qty 250

## 2023-03-24 MED ORDER — SOD CITRATE-CITRIC ACID 500-334 MG/5ML PO SOLN: 30.0000 mL | ORAL | Status: DC | PRN

## 2023-03-24 MED ORDER — IBUPROFEN 600 MG PO TABS
600.0000 mg | ORAL_TABLET | Freq: Four times a day (QID) | ORAL | Status: DC
  Administered 2023-03-24 – 2023-03-26 (×7): 600 mg via ORAL
  Filled 2023-03-24 (×8): qty 1

## 2023-03-24 MED ORDER — FENTANYL CITRATE (PF) 100 MCG/2ML IJ SOLN: 50.0000 ug | INTRAMUSCULAR | Status: DC | PRN

## 2023-03-24 MED ORDER — FENTANYL-BUPIVACAINE-NACL 0.5-0.125-0.9 MG/250ML-% EP SOLN
EPIDURAL | Status: DC | PRN
  Administered 2023-03-24: 12 mL/h via EPIDURAL

## 2023-03-24 MED ORDER — ONDANSETRON HCL 4 MG/2ML IJ SOLN: 4.0000 mg | INTRAMUSCULAR | Status: DC | PRN

## 2023-03-24 NOTE — Progress Notes (Signed)
Christina [name] is a 27 y.o. G3P1011 at [redacted]w[redacted]d by LMP admitted for rupture of membranes. History of previous C/S currently A1GDM.   Subjective: Patient doing well. Introductions exchanged. Patient Doula and FOB at bedside resting .   Objective: BP (!) 106/57   Pulse 67   Resp 16   LMP 06/23/2022   SpO2 100%  No intake/output data recorded. No intake/output data recorded.  FHT:  FHR: 125 bpm, variability: moderate,  accelerations:  Present,  decelerations:  Absent UC:   irregular, every 7-10 minutes SVE:   Dilation: 5 Effacement (%): 80 Station: -1 Exam by:: [employer] [name]  Labs: Lab Results  Component Value Date   WBC 5.6 03/24/2023   HGB 15.6 (H) 03/24/2023   HCT 48.0 (H) 03/24/2023   MCV 85.4 03/24/2023   PLT 190 03/24/2023    Assessment / Plan: Augmentation of labor, progressing well. Pit was discontinued at patient request.   Labor:  Plan to restart pit 2x2. Titrate appropriately as needed.  Fetal Wellbeing:  Category II- continuous monitoring  Pain Control:  Epidural I/D:   GBS positive < PCN  Anticipated MOD:   VBAC at this time. OR on standby if needed.   Claudette Head, [name] 03/24/2023, 9:04 AM

## 2023-03-24 NOTE — Anesthesia Preprocedure Evaluation (Addendum)
Anesthesia Evaluation  Patient identified by MRN, date of birth, ID band Patient awake    Reviewed: Allergy & Precautions, NPO status , Patient's Chart, lab work & pertinent test results  Airway Mallampati: II  TM Distance: >3 FB Neck ROM: Full    Dental no notable dental hx. (+) Teeth Intact, Dental Advisory Given   Pulmonary neg pulmonary ROS   Pulmonary exam normal breath sounds clear to auscultation       Cardiovascular negative cardio ROS Normal cardiovascular exam Rhythm:Regular Rate:Normal     Neuro/Psych   Anxiety     negative neurological ROS     GI/Hepatic negative GI ROS, Neg liver ROS,,,  Endo/Other  diabetes, Gestational    Renal/GU negative Renal ROS     Musculoskeletal   Abdominal  (+) + obese (BMI 37.0)  Peds  Hematology Lab Results      Component                Value               Date                      WBC                      5.6                 03/24/2023                HGB                      15.6 (H)            03/24/2023                HCT                      48.0 (H)            03/24/2023                MCV                      85.4                03/24/2023                PLT                      190                 03/24/2023              Anesthesia Other Findings All: Latex  Reproductive/Obstetrics (+) Pregnancy                             Anesthesia Physical Anesthesia Plan  ASA: 3  Anesthesia Plan: Epidural   Post-op Pain Management:    Induction:   PONV Risk Score and Plan:   Airway Management Planned:   Additional Equipment:   Intra-op Plan:   Post-operative Plan:   Informed Consent: I have reviewed the patients History and Physical, chart, labs and discussed the procedure including the risks, benefits and alternatives for the proposed anesthesia with the patient or authorized representative who has indicated his/her understanding and  acceptance.       Plan Discussed  with:   Anesthesia Plan Comments: (39.1 wk G3P1 for TOLAC under LEA)       Anesthesia Quick Evaluation

## 2023-03-24 NOTE — Procedures (Signed)
  Post-Placental IUD Insertion Procedure Note  Patient identified, informed consent signed prior to delivery, signed copy in chart, time out was performed.    Vaginal, labial and perineal areas thoroughly inspected for lacerations. 1st degree laceration identified - not hemostatic,repaired prior to insertion of IUD.  Mirena  - IUD grasped between sterile gloved fingers. Sterile lubrication applied to sterile gloved hand for ease of insertion. Fundus identified through abdominal wall using non-insertion hand. IUD inserted to fundus with bimanual technique. IUD carefully released at the fundus and insertion hand gently removed from vagina.    Strings trimmed to the level of the introitus. Patient tolerated procedure well.  Lot # [id number] Expiration Date June /2026   Patient given post procedure instructions and IUD care card with expiration date.  Patient is asked to keep IUD strings tucked in her vagina until her postpartum follow up visit in 4-6 weeks. Patient advised to abstain from sexual intercourse and pulling on strings before her follow-up visit. Patient verbalized an understanding of the plan of care and agrees.  Christina [name] Danella Deis) Suzie Portela, MSN, [name]  Center for [facility]  03/24/23 3:21 PM

## 2023-03-24 NOTE — H&P (Addendum)
OBSTETRIC ADMISSION HISTORY AND PHYSICAL  Christina [name] is a 27 y.o. female G3P1011 with IUP at [redacted]w[redacted]d by LMP presenting for SROM at 0100, TOLAC. She reports +FMs, no VB, no blurry vision, headaches or peripheral edema, and RUQ pain.  She plans on breast and bottle feeding. She request IUD for birth control. She received her prenatal care at [facility]   Dating: By LMP --->  Estimated Date of Delivery: 03/30/23  Sono:    @[redacted]w[redacted]d , CWD, normal anatomy, cephalic presentation, 2654g, 16% EFW   Prenatal History/Complications:  -A1GDM -GBS positive -Hx prior IUGR delivery at 37w -Anxiety -Short cervical length -Skene's duct cyst -Hx cesarean delivery  Past Medical History: Past Medical History:  Diagnosis Date   Gestational diabetes     Past Surgical History: Past Surgical History:  Procedure Laterality Date   CESAREAN SECTION     WISDOM TOOTH EXTRACTION      Obstetrical History: OB History     Gravida  3   Para  1   Term  1   Preterm  0   AB  1   Living  1      SAB  1   IAB  0   Ectopic  0   Multiple  0   Live Births  1           Social History Social History   Socioeconomic History   Marital status: Single    Spouse name: Not on file   Number of children: 1   Years of education: Not on file   Highest education level: Not on file  Occupational History   Not on file  Tobacco Use   Smoking status: Never   Smokeless tobacco: Never  Vaping Use   Vaping Use: Never used  Substance and Sexual Activity   Alcohol use: Not Currently   Drug use: Not Currently    Types: Marijuana   Sexual activity: Yes  Other Topics Concern   Not on file  Social History Narrative   Not on file   Social Determinants of Health   Financial Resource Strain: Not on file  Food Insecurity: Food Insecurity Present (02/13/2023)   Hunger Vital Sign    Worried About Running Out of Food in the Last Year: Sometimes true    Ran Out of Food in the Last Year: Sometimes true   Transportation Needs: No Transportation Needs (08/31/2019)   PRAPARE - Administrator, Civil Service (Medical): No    Lack of Transportation (Non-Medical): No  Physical Activity: Not on file  Stress: Not on file  Social Connections: Not on file    Family History: Family History  Problem Relation Age of Onset   Asthma Mother    Hypertension Maternal Grandmother    Cancer Neg Hx    Diabetes Neg Hx    Heart disease Neg Hx    Stroke Neg Hx     Allergies: Allergies  Allergen Reactions   Bee Pollen Other (See Comments)    Nasal congestion and itching eye   Pollen Extract    Latex Rash    Medications Prior to Admission  Medication Sig Dispense Refill Last Dose   Prenatal Vit-Fe Fumarate-FA (MULTIVITAMIN-PRENATAL) 27-0.8 MG TABS tablet Take 1 tablet by mouth daily at 12 noon.   03/24/2023   Accu-Chek Softclix Lancets lancets DX: O24.419;Check BS 4 times a day. 100 each 12    Blood Glucose Monitoring Suppl (ACCU-CHEK GUIDE) w/Device KIT DX: O24.419;Check BS 4 times a day.  1 kit 0    Docusate Sodium (DSS) 250 MG CAPS Take by mouth. (Patient not taking: Reported on 03/20/2023)      ferrous sulfate 325 (65 FE) MG tablet Take 1 tablet by mouth daily. (Patient not taking: Reported on 03/20/2023)      Ferrous Sulfate Dried (SLOW RELEASE IRON) 45 MG TBCR Take by mouth.      glucose blood test strip DX: O24.419;Check BS 4 times a day. 100 each 12    ondansetron (ZOFRAN-ODT) 4 MG disintegrating tablet Take 4 mg by mouth every 8 (eight) hours as needed for nausea or vomiting. (Patient not taking: Reported on 03/20/2023)      Prenatal Vit-Min-FA-Fish Oil ([employer] PRENATAL GUMMY) 0.4-113.5 MG CHEW Chew by mouth. (Patient not taking: Reported on 03/20/2023)        Review of Systems   All systems reviewed and negative except as stated in HPI  Blood pressure 135/85, last menstrual period 06/23/2022, SpO2 98 %, unknown if currently breastfeeding. General appearance: alert and no distress Lungs:  clear to auscultation bilaterally Heart: regular rate and rhythm Abdomen: soft, non-tender; bowel sounds normal Extremities: Homans sign is negative, no sign of DVT Fetal monitoringBaseline: 135 bpm, Variability: Good {> 6 bpm), Accelerations: Reactive, and Decelerations: Absent Uterine activityFrequency: Every 3 minutes  Dilation: 1.5 Effacement (%): 90 Station: -2 Exam by:: Estanislado SpireE Lawrence NP   Prenatal labs: ABO, Rh:   Antibody:   Rubella:   RPR: Non Reactive (01/25 0831)  HBsAg:    HIV: Non Reactive (01/25 0831)  GBS: Positive/-- (03/19 1133)    Prenatal Transfer Tool  Maternal Diabetes: Yes:  Diabetes Type:  Diet controlled Genetic Screening: Normal Maternal Ultrasounds/Referrals: Normal Fetal Ultrasounds or other Referrals:  Referred to Materal Fetal Medicine  Maternal Substance Abuse:  No Significant Maternal Medications:  None Significant Maternal Lab Results:  Group B Strep positive Number of Prenatal Visits:greater than 3 verified prenatal visits Other Comments:  None  Results for orders placed or performed during the hospital encounter of 03/24/23 (from the past 24 hour(s))  Glucose, capillary   Collection Time: 03/24/23  3:31 AM  Result Value Ref Range   Glucose-Capillary 138 (H) 70 - 99 mg/dL    Patient Active Problem List   Diagnosis Date Noted   Indication for care in labor or delivery 03/24/2023   False labor 03/18/2023   Preterm labor 02/04/2023   Gestational diabetes mellitus 01/16/2023   SGA (small for gestational age) 01/07/2023   Supervision of other normal pregnancy, antepartum 12/24/2022   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 G3P1011 at 1581w1d here for TOLAC, SROM at 0100  #Labor:expectant  management #A1GDM: diet controlled, monitor CBGs #Pain: Per patient #FWB: Cat I #ID:  GBS+, PCN  #MOF: both #MOC: IUD #Circ:  yes  Vonna DraftsAtif Mahmood, MD  03/24/2023, 4:45 AM  Attestation of Supervision of Student:  I confirm that I have verified the information documented in the  resident  student's note and that I have also personally reperformed the history, physical exam and all medical decision making activities.  I have verified that all services and findings are accurately documented in this student's note; and I agree with management and plan as outlined in the documentation. I have also made  any necessary editorial changes.  [name] at bedside for confirmation that patient desire TOLAC. Per discussion with patient, she was in early labor when the baby's heart rate dropped and never came back up. She states she was rushed for an emergency c/s.   Patient tearful and very uncomfortable with contractions. States she wants to [facility] right now but will let [name] know if she changes her mind. Patient with significant anxiety regarding repeat c/s but not coping well with pain. Patient states if she has to have c/s or changes her mind, she desires general anesthesia. [name] discussed that general anesthesia has more risks to patient than spinal and questioned why patient would desire. Patient states she had general with her first c/s, reports significant anxiety and states she is unsure how she will do in the OR when she is unable to move and "being cut open." [name] validated feelings and discussed medication options for anxiety. Discussed that anxiety alone may not be enough for anesthesia to do general because we have options to help manage. Encouraged patient to speak with anesthesia team regarding concerns if needed.   [name] also discussed with patient pain management plan in labor due to her concerns over not being able to move. Discussed mechanism of action for epidural and very similar feeling to spinal for c/s.  Patient states she was planning unmedicated delivery but reports this is very painful and she is now unsure. [name] reviewed options for nitrous and IV pain medication with patient. Patient desires IV pain medication for now.   Very thick meconium with SROM. Will have NICU team at delivery.   Rolm Bookbinder, [name] [facility] [employer] 03/24/2023 4:48 AM

## 2023-03-24 NOTE — Progress Notes (Signed)
Labor Progress Note Truda Foland is a 27 y.o. G3P1011 at [redacted]w[redacted]d presented for SROM/spontaneous onset of labor  S:  Patient comfortable with epidural  O:  BP 129/70   Pulse 69   Resp 18   LMP 06/23/2022   SpO2 100%   Fetal Tracing:  Baseline: 120 Variability: moderate Accels: 15x15 Decels: none  Toco: 2-4   CVE: Dilation: 5 Effacement (%): 80 Cervical Position: Middle Station: -1 Presentation: Vertex Exam by:: Druscilla Brownie [name]   A&P: 27 y.o. G3P1011 [redacted]w[redacted]d SROM/SOL #Labor: Progressing well. Continue expectant management for now.   -Elevated BPs before epidural placement, likely pain related but will check HTN labs. Denies any HA, visual changes or epigastric pain.  #Pain: epidural #FWB: Cat 1 #GBS positive   Rolm Bookbinder, [name] 6:36 AM

## 2023-03-24 NOTE — MAU Note (Signed)
.  Christina [name] is a 27 y.o. at [redacted]w[redacted]d here in MAU reporting: SROM, A1GDM  Contractions every: 2-51minutes Onset of ctx: Yesterday Pain score: 9/10  ROM: gross SROM with thick meconium stained fluid at 0130     Vaginal Bleeding: none  Last SVE: 1  Epidural: Planning  Fetal Movement: Reports positive FM FHT:151bpm via External  There were no vitals filed for this visit.     OB Office: Faculty GBS: Positive HSV: Denies hx of HSV Lab orders placed from triage: MAU Labor Eval

## 2023-03-24 NOTE — Discharge Summary (Signed)
Postpartum Discharge Summary  Date of Service updated***     Patient Name: Chrisandra Stoltenberg DOB: [birth year] MRN: [id number]  Date of admission: 03/24/2023 Delivery date:03/24/2023  Delivering provider: Carlynn Herald  Date of discharge: 03/24/2023  Admitting diagnosis: Indication for care in labor or delivery [O75.9] Intrauterine pregnancy: [redacted]w[redacted]d     Secondary diagnosis:  Principal Problem:   Indication for care in labor or delivery  Additional problems: ***    Discharge diagnosis: {DX.:23714}                                              Post partum procedures:{Postpartum procedures:23558} Augmentation: {Augmentation:20782} Complications: {OB Labor/Delivery Complications:20784}  Hospital course: {Courses:23701}  Magnesium Sulfate received: {Mag received:30440022} BMZ received: {BMZ received:30440023} Rhophylac:{Rhophylac received:30440032} BRA:{XEN:40768088} T-DaP:{Tdap:23962} Flu: {PJS:31594} Transfusion:{Transfusion received:30440034}  Physical exam  Vitals:   03/24/23 1231 03/24/23 1413 03/24/23 1416 03/24/23 1431  BP: 125/61 113/65 113/64 112/66  Pulse: 68 (!) 101 92 84  Resp:      Temp:      TempSrc:      SpO2:       General: {Exam; general:21111117} Lochia: {Desc; appropriate/inappropriate:30686::"appropriate"} Uterine Fundus: {Desc; firm/soft:30687} Incision: {Exam; incision:21111123} DVT Evaluation: {Exam; dvt:2111122} Labs: Lab Results  Component Value Date   WBC 5.6 03/24/2023   HGB 15.6 (H) 03/24/2023   HCT 48.0 (H) 03/24/2023   MCV 85.4 03/24/2023   PLT 190 03/24/2023      Latest Ref Rng & Units 03/24/2023    4:51 AM  CMP  Glucose 70 - 99 mg/dL 585   BUN 6 - 20 mg/dL 8   Creatinine 9.29 - 2.44 mg/dL 6.28   Sodium 638 - 177 mmol/L 132   Potassium 3.5 - 5.1 mmol/L 3.4   Chloride 98 - 111 mmol/L 100   CO2 22 - 32 mmol/L 16   Calcium 8.9 - 10.3 mg/dL 9.1   Total Protein 6.5 - 8.1 g/dL 6.7   Total Bilirubin 0.3 - 1.2 mg/dL 0.2   Alkaline  Phos 38 - 126 U/L 206   AST 15 - 41 U/L 16   ALT 0 - 44 U/L 16    Edinburgh Score:     No data to display           After visit meds:  Allergies as of 03/24/2023       Reactions   Bee Pollen Other (See Comments)   Nasal congestion and itching eye   Pollen Extract    Latex Rash     Med Rec must be completed prior to using this SMARTLINK***        Discharge home in stable condition Infant Feeding: {Baby feeding:23562} Infant Disposition:{CHL IP OB HOME WITH NHAFBX:03833} Discharge instruction: per After Visit Summary and Postpartum booklet. Activity: Advance as tolerated. Pelvic rest for 6 weeks.  Diet: {OB XOVA:91916606} Future Appointments: Future Appointments  Date Time Provider Department Center  03/25/2023 10:55 AM Judeth Horn, NP [facility] None  04/01/2023 10:55 AM Gerrit Heck, [name] [facility] None  04/09/2023  1:15 PM [facility]   Follow up Visit:   Please schedule this patient for a In virtual postpartum visit in 6 weeks with the following provider: Any provider. Additional Postpartum F/U: 4 weeks    Low risk pregnancy complicated by:  A1GDM  Delivery mode:  Vaginal, Spontaneous  VBAC  Anticipated Birth  Control:   PP IUD  placed    03/24/2023 Claudette Head, [name]

## 2023-03-24 NOTE — Lactation Note (Signed)
This note was copied from a baby's chart. Lactation Consultation Note  Patient Name: Christina [name] BFXOV'A Date: 03/24/2023 Age:27 years  Reason for consult: Initial assessment;Maternal endocrine disorder;Term (GDM-diet)  P2, GA [redacted]w[redacted]d, GDM  Infant's first blood sugar was 38. Mother has GDM. Upon entry to room, mother is holding baby skin to skin. He is sleepy and did not latch. Mother has given consent to supplement baby with donor breast milk. RN is feeding baby 10 ml DBM.   Mother asking about low blood sugar management and questions answered. Discussed that when baby is given donor breast milk and if more is needed, we would like mother to pump after breastfeeding, stimulate her supply and feed baby her own breast milk for supplement.   Mother states she had an overproduction of breast milk that caused her lots of work and stress. She feels that pumping early was a contributing factor and plans to only latch baby to the breast and does not want to use a breast pump.   Mother encouraged to call for assistance with breastfeeding as needed. Mom made aware of O/P services, breastfeeding support groups, community resources, and our phone # for post-discharge questions.     Maternal Data Does the patient have breastfeeding experience prior to this delivery?: Yes How long did the patient breastfeed?: 6 months  Feeding Mother's Current Feeding Choice: Breast Milk and Formula Nipple Type: Slow - flow   Interventions Interventions: Education;LC Services brochure   Consult Status Consult Status: Follow-up Date: 03/25/23 Follow-up type: Out-patient   Christella Hartigan M 03/24/2023, 6:14 PM

## 2023-03-24 NOTE — Anesthesia Procedure Notes (Signed)
Epidural Patient location during procedure: OB Start time: 03/24/2023 5:49 AM End time: 03/24/2023 6:02 AM  Staffing Anesthesiologist: Trevor Iha, MD Performed: anesthesiologist   Preanesthetic Checklist Completed: patient identified, IV checked, site marked, risks and benefits discussed, surgical consent, monitors and equipment checked, pre-op evaluation and timeout performed  Epidural Patient position: sitting Prep: DuraPrep and site prepped and draped Patient monitoring: continuous pulse ox and blood pressure Approach: midline Location: L3-L4 Injection technique: LOR air  Needle:  Needle type: Tuohy  Needle gauge: 17 G Needle length: 9 cm and 9 Needle insertion depth: 6 cm Catheter type: closed end flexible Catheter size: 19 Gauge Catheter at skin depth: 12 cm Test dose: negative  Assessment Events: blood not aspirated, no cerebrospinal fluid, injection not painful, no injection resistance, no paresthesia and negative IV test  Additional Notes Patient identified. Risks/Benefits/Options discussed with patient including but not limited to bleeding, infection, nerve damage, paralysis, failed block, incomplete pain control, headache, blood pressure changes, nausea, vomiting, reactions to medication both or allergic, itching and postpartum back pain. Confirmed with bedside nurse the patient's most recent platelet count. Confirmed with patient that they are not currently taking any anticoagulation, have any bleeding history or any family history of bleeding disorders. Patient expressed understanding and wished to proceed. All questions were answered. Sterile technique was used throughout the entire procedure. Please see nursing notes for vital signs. Test dose was given through epidural needle and negative prior to continuing to dose epidural or start infusion. Warning signs of high block given to the patient including shortness of breath, tingling/numbness in hands, complete motor  block, or any concerning symptoms with instructions to call for help. Patient was given instructions on fall risk and not to get out of bed. All questions and concerns addressed with instructions to call with any issues.  1 Attempt (S) . Patient tolerated procedure well.

## 2023-03-24 NOTE — Progress Notes (Signed)
Christina [name] is a 27 y.o. G3P1011 at [redacted]w[redacted]d by LMP admitted for SOL with SROM @ 0100 (thick mec). History of previous C/S currently A1GDM.   Subjective: Patient request a vaginal exam. Doing well.   Objective: BP (!) 106/57   Pulse 67   Resp 16   LMP 06/23/2022   SpO2 100%  No intake/output data recorded. No intake/output data recorded.  FHT:  FHR: 120 bpm, variability: moderate,  accelerations:  Present,  decelerations:  Absent UC:   irregular, every 8-9 minutes SVE:   Dilation: Lip/rim Effacement (%): 100 Station: Plus 1 Exam by:: Dorathy Daft, [name]`  Labs: Lab Results  Component Value Date   WBC 5.6 03/24/2023   HGB 15.6 (H) 03/24/2023   HCT 48.0 (H) 03/24/2023   MCV 85.4 03/24/2023   PLT 190 03/24/2023   CBG (last 3)  Recent Labs    03/24/23 0331 03/24/23 0735  GLUCAP 138* 124*    Assessment / Plan: Spontaneous labor, progressing normally.   Labor:  Continue expectant management to complete.  Fetal Wellbeing:  Category II- continuous monitoring  Pain Control:  Epidural I/D:   GBS positive > PCN  Anticipated MOD:   VBAC at this time. OR on standby if needed   Claudette Head, [name] 03/24/2023, 11:00 AM

## 2023-03-25 ENCOUNTER — Encounter: Payer: Medicaid Other | Admitting: Student

## 2023-03-25 ENCOUNTER — Encounter (HOSPITAL_COMMUNITY): Payer: Self-pay | Admitting: Obstetrics & Gynecology

## 2023-03-25 ENCOUNTER — Other Ambulatory Visit: Payer: Self-pay

## 2023-03-25 LAB — CBC
HCT: 29.6 % — ABNORMAL LOW (ref 36.0–46.0)
Hemoglobin: 9.6 g/dL — ABNORMAL LOW (ref 12.0–15.0)
MCH: 28.4 pg (ref 26.0–34.0)
MCHC: 32.4 g/dL (ref 30.0–36.0)
MCV: 87.6 fL (ref 80.0–100.0)
Platelets: 230 10*3/uL (ref 150–400)
RBC: 3.38 MIL/uL — ABNORMAL LOW (ref 3.87–5.11)
RDW: 15.6 % — ABNORMAL HIGH (ref 11.5–15.5)
WBC: 11.6 10*3/uL — ABNORMAL HIGH (ref 4.0–10.5)
nRBC: 0 % (ref 0.0–0.2)

## 2023-03-25 NOTE — Lactation Note (Signed)
This note was copied from a baby's chart. Lactation Consultation Note  Patient Name: Christina [name] VFIEP'P Date: 03/25/2023 Age:29 hours  RN Lynnae Sandhoff will ask Birth Parent if she would like to be seen by [facility] services tonight to help with latch assistance,  and RN will call LC back on Vocera if Birth Parent would like to be seen..    Maternal Data    Feeding Nipple Type: Slow - flow  LATCH Score                    Lactation Tools Discussed/Used    Interventions    Discharge    Consult Status      Christina [name] 03/25/2023, 2:02 AM

## 2023-03-25 NOTE — Plan of Care (Signed)

## 2023-03-25 NOTE — Progress Notes (Signed)
Post Partum Day 1  Subjective: up ad lib, voiding, tolerating PO, and + flatus Patient endorses intermittent pain radiating down the back of her right leg. She states it started after delivery prior to her transfer to postpartum. Pain resolved with Oxycodone and has not recurred. She is ambulating normally  Objective: Blood pressure 124/60, pulse 69, temperature 98.3 F (36.8 C), temperature source Oral, resp. rate 18, last menstrual period 06/23/2022, SpO2 100 %, unknown if currently breastfeeding.  Physical Exam:  General: alert, cooperative, appears stated age, and no distress Lochia: appropriate Uterine Fundus: Deferred due to recent RN assessment Incision: N/A DVT Evaluation: No evidence of DVT seen on physical exam.  Recent Labs    03/24/23 0433 03/25/23 0203  HGB 15.6* 9.6*  HCT 48.0* 29.6*    Assessment/Plan: Patient plans discharge home tomorrow, working on latch with Lactation today No atypical findings on assessment of RLE: normal pulse and cap refill, no impact on weight-bearing ability S/p visit to bedside by Dr. Salvadore Farber, Anesthesia, no acute concerns Plan for d/c home tomorrow   LOS: 1 day   Calvert Cantor, [name] 03/25/2023, 6:23 AM

## 2023-03-25 NOTE — Social Work (Signed)
CSW received consult for hx of Anxiety.  CSW met with MOB to offer support and complete assessment. CSW entered the room, and observed MOB resting in bed and the infant lying in the bassinet. CSW introduced self, CSW role and reason for visit. MOB was agreeable to visit. CSW inquired about how MOB was feeling, MOB reported good. CSW inquired about MOB MH hx, MOB reported she was diagnosed not long after she graduated high school. MOB reports at that time she was put on medication. MOB reported a stable mood and no symptoms up until she get pregnant. MOB reported she notified an increase in her anxiety during pregnancy. MOB reported she was anxious about leaving the house, going the the grocery store, stopping to get gas so she just wouldn't do those things without her partner. MOB reported she is currently in therapy with Asher Muir and has seen improvements with her mental health. MOB reported she plans to continue therapy postpartum. CSW assessed for safety, MOB denied any SI or HI. CSW provided education regarding the baby blues period vs. perinatal mood disorders, discussed treatment and gave resources for mental health follow up if concerns arise.  CSW recommends self-evaluation during the postpartum time period using the New Mom Checklist from Postpartum Progress and encouraged MOB to contact a medical professional if symptoms are noted at any time.    CSW provided review of Sudden Infant Death Syndrome (SIDS) precautions. MOB reported they have all necessary items for the infant including a bassinet, crib and car seat. CSW identifies no further need for intervention and no barriers to discharge at this time.  Wende Neighbors, LCSWA Clinical Social Worker [phone]

## 2023-03-25 NOTE — Lactation Note (Signed)
This note was copied from a baby's chart. Lactation Consultation Note  Patient Name: Christina [name] Date: 03/25/2023 Age:29 hours  Reason for consult: Mother's request;Difficult latch;Follow-up assessment;Term;Breastfeeding assistance  P2, GA [redacted]w[redacted]d, 1% weight loss  Called to room at mother's request for assistance with latch. Mother has breast fed twice during the night and bottle fed donor breast milk x 5. Basic breastfeeding education in cross cradle position. Mother can easily express colostrum and swallowing was observed during infant's feeding. Infant is tucking lower lip causing discomfort to mother. Re-latching and readjustment was done to improve latch. Mother had to go to the bathroom and feeding stopped.  Reviewed latching in an asymmetrical latch to get get lower lip and chin deeper in the breast and use alternate breast massage to increase milk transfer.  Encouraged to latch baby with early feeding cues, 8-12/24 hrs to reduce the need to give donor breast milk especially due to the amount of colostrum. Instructed to call nurse/ LC for assistance with latch, as needed.   Maternal Data Has patient been taught Hand Expression?: Yes Does the patient have breastfeeding experience prior to this delivery?: Yes How long did the patient breastfeed?: 6 months (breastfeeding and pumping)  Feeding Mother's Current Feeding Choice: Breast Milk and Donor Milk Nipple Type: Slow - flow  LATCH Score Latch: Grasps breast easily, tongue down, lips flanged, rhythmical sucking.  Audible Swallowing: Spontaneous and intermittent  Type of Nipple: Everted at rest and after stimulation  Comfort (Breast/Nipple): Soft / non-tender  Hold (Positioning): Assistance needed to correctly position infant at breast and maintain latch.  LATCH Score: 9   Interventions Interventions: Breast feeding basics reviewed;Assisted with latch;Hand express;Breast compression;Adjust position;Support  pillows      Consult Status Consult Status: Follow-up Date: 03/26/23 Follow-up type: In-patient    Christella Hartigan M 03/25/2023, 10:31 AM

## 2023-03-25 NOTE — Anesthesia Postprocedure Evaluation (Signed)
Anesthesia Post Note  Patient: Christina [name]  Procedure(s) Performed: AN AD HOC LABOR EPIDURAL     Patient location during evaluation: Mother Baby Anesthesia Type: Epidural Level of consciousness: awake Pain management: satisfactory to patient Vital Signs Assessment: post-procedure vital signs reviewed and stable Respiratory status: spontaneous breathing Cardiovascular status: stable Anesthetic complications: no  No notable events documented.  Last Vitals:  Vitals:   03/25/23 0639 03/25/23 1430  BP: 115/62 135/70  Pulse: 70 87  Resp: 18 18  Temp: 36.7 C 36.7 C  SpO2: 98% 100%    Last Pain:  Vitals:   03/25/23 1430  TempSrc: Oral  PainSc:    Pain Goal:                   [employer]

## 2023-03-25 NOTE — Progress Notes (Signed)
Called by RN at 04:00 to evaluate pt's RLE pain, which she describes as shooting pain down the back of her  R buttock and into her R leg. Epidural catheter site appears normal, very slightly tender to the touch which I told her is to be expected for 1-2weeks. Normal sensation and motor B/L, has been ambulating to and using the bathroom without issues.   I explained to her that sciatic nerve stretching can inadvertently happen when the legs are positioned in stirrups for pushing while numb from the epidural. Nerve stretching can take weeks to months to resolve, but I encouraged ambulation, leg stretching and muscle relaxants as necessary.

## 2023-03-26 ENCOUNTER — Inpatient Hospital Stay (HOSPITAL_COMMUNITY): Payer: Medicaid Other

## 2023-03-26 DIAGNOSIS — M79651 Pain in right thigh: Secondary | ICD-10-CM

## 2023-03-26 MED ORDER — SLOW RELEASE IRON 45 MG PO TBCR: 1.0000 | EXTENDED_RELEASE_TABLET | Freq: Every day | ORAL | 0 refills | Status: DC

## 2023-03-26 MED ORDER — ACETAMINOPHEN 325 MG PO TABS: 650.0000 mg | ORAL_TABLET | ORAL | 1 refills | Status: DC | PRN

## 2023-03-26 MED ORDER — IBUPROFEN 600 MG PO TABS: 600.0000 mg | ORAL_TABLET | Freq: Four times a day (QID) | ORAL | 0 refills | Status: DC

## 2023-03-26 NOTE — Progress Notes (Signed)
Preprocedural Counseling: Parent desires circumcision for this female infant.  Circumcision procedure details discussed, risks and benefits of procedure were also discussed.  The benefits include but are not limited to: reduction in the rates of urinary tract infection (UTI), penile cancer, sexually transmitted infections including HIV, penile inflammatory and retractile disorders.   Risks include but are not limited to: bleeding, infection, injury of glans which may lead to penile deformity or urinary tract issues or Urology intervention, unsatisfactory cosmetic appearance and other potential complications related to the procedure.  It was emphasized that this is an elective procedure.    

## 2023-03-28 ENCOUNTER — Ambulatory Visit (HOSPITAL_COMMUNITY): Payer: Self-pay

## 2023-03-28 NOTE — Lactation Note (Signed)
This note was copied from a baby's chart.  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 Elmquist is a P2 and experienced breastfeeding. This LC assisted with the 3:30 pm feeding, Ms. Romm had baby at both breasts in cross cradle and football hold, but she kept complaining of nipple pain on R side. Once NS # 16 was placed, Ms. Kotch voiced a slight improvement; baby was latched deep but she did mentioned that her nipple was hurting from a feeding she did yesterday. Reviewed pumping schedule, lactogenesis II, sore nipples, supplementation and anticipatory guidelines.   OBJECTIVE Infant data: Mother's Current Feeding Choice: Breast Milk and Donor Milk  Infant feeding assessment Scale for Readiness: 2 Scale for Quality: 3   Maternal data: G3P1011  Vaginal, Spontaneous Current breast feeding challenges:: NICU admission Pumping frequency: 3 times/24 hours but she has also been putting baby to breast Pumped volume: 18 mL (18-20 ml) Flange Size: 24 Risk factor for low milk supply:: infant separation  WIC Program: Yes WIC Referral Sent?: Yes [place] county[place]  ASSESSMENT Infant: LATCH Documentation Latch: 2 (needed some stimulation to keep on with the NS feeding pattern, NS # 16 was used on the R side (the challenging one)) Audible Swallowing: 2 Type of Nipple: 2 (short shafted) Comfort (Breast/Nipple): 1 (L side was non-tender but R side was uncomfortable without the NS # 16) Hold (Positioning): 1 (minimal assistance needed) LATCH Score: 8  Feeding Status: Ad lib  Maternal: Milk volume: Normal  INTERVENTIONS/PLAN Interventions: Interventions: Breast feeding basics reviewed;  DEBP; Assisted with latch; Skin to skin; Breast compression; Adjust position; Support pillows; Position options; Education Discharge Education: Engorgement and breast care Tools: Pump; Flanges; Nipple Dorris Carnes Pump Education: Setup, frequency, and cleaning; Milk Storage Nipple shield size: 16  Plan: Encouraged to continue putting baby to breast on feeding cues using NS # 16 PRN Parents will continue supplementing with EBM/donor  Pumping after feedings/attempts at the breast was also strongly encouraged   FOB present and supportive. All questions and concerns answered, family to contact [facility] services PRN.  Consult Status: NICU follow-up NICU Follow-up type: Verify absence of engorgement; Assist with IDF-2 (Mother does not need to pre-pump before breastfeeding)   [place] Venetia Constable 03/28/2023, 4:21 PM

## 2023-03-29 ENCOUNTER — Ambulatory Visit (HOSPITAL_COMMUNITY): Payer: Self-pay

## 2023-03-29 NOTE — Lactation Note (Signed)
This note was copied from a baby's chart.  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 She called out for assistance because she had questions regarding some "knots" on her R breast (see maternal assessment); she also reported her milk is in. Provided ice packs and storage bottles per her request. Baby was been breastfeeding consistently but she can tell he's no fully emptying the breast. Let her know that pumping after feedings at the breast will protect her supply and aid to the prevention of engorgement. She had a question regarding circumcision and breastfeeding, d/c planning is underway.   OBJECTIVE Infant data: Mother's Current Feeding Choice: Breast Milk and Donor Milk  Infant feeding assessment Scale for Readiness: 1 Scale for Quality: 2   Maternal data: G3P1011  Vaginal, Spontaneous Current breast feeding challenges:: NICU admission Pumping frequency: only a few times, she's been mostly taking baby to breast ad lib Pumped volume: 30 mL (30-40 ml) Flange Size: 24 Risk factor for low milk supply:: infant separation  WIC Program: Yes WIC Referral Sent?: Yes [place] county[place]  ASSESSMENT Infant: LATCH Documentation Latch: 1 Audible Swallowing: 2 Type of Nipple: 2 Comfort (Breast/Nipple): 2 Hold (Positioning): 2 LATCH Score: 9  Feeding Status: Ad lib  Maternal: Milk volume: Normal No S/S of engorgement at this time; but breast are filling in which is consistent with the onset of lactogenesis II  INTERVENTIONS/PLAN Interventions: Interventions: Breast feeding basics reviewed; DEBP; Education; Ice Discharge Education:  Engorgement and breast care Tools: Pump; Flanges Pump Education: Setup, frequency, and cleaning; Milk Storage Nipple shield size: 16  Plan: Encouraged to continue putting baby to breast on feeding cues using NS # 16 PRN Parents will continue supplementing with EBM/donor as a back up if mom is too tired/sleepy to breastfeed Pumping after feedings/attempts at the breast was also strongly encouraged    No other support person at this time. All questions and concerns answered, family to contact [facility] services PRN.  Consult Status: NICU follow-up NICU Follow-up type: Assist with IDF-2 (Mother does not need to pre-pump before breastfeeding); Weekly NICU follow up   Christina [name] 03/29/2023, 3:08 PM

## 2023-03-31 ENCOUNTER — Ambulatory Visit (HOSPITAL_COMMUNITY): Payer: Self-pay

## 2023-03-31 NOTE — BH Specialist Note (Signed)
Integrated Behavioral Health via Telemedicine Visit  04/09/2023 Christina [name] [id number]  Number of Integrated Behavioral Health Clinician visits: 3- Third Visit  Session Start time: 1317   Session End time: 1401  Total time in minutes: 44   Referring Provider: Wynelle Bourgeois, [name] Patient/Family location: Home [facility] Provider location: [facility]  All persons participating in visit: [name] and [facility] Christina [name]   Types of Service: Individual psychotherapy and Video visit  I connected with [name] and/or [name]  n/a  via  Telephone or Video Enabled Telemedicine Application  (Video is Caregility application) and verified that I am speaking with the correct person using two identifiers. Discussed confidentiality: Yes   I discussed the limitations of telemedicine and the availability of in person appointments.  Discussed there is a possibility of technology failure and discussed alternative modes of communication if that failure occurs.  I discussed that engaging in this telemedicine visit, they consent to the provision of behavioral healthcare and the services will be billed under their insurance.  Patient and/or legal guardian expressed understanding and consented to Telemedicine visit: Yes   Presenting Concerns: Patient and/or family reports the following symptoms/concerns: Increased anxiety, forgetfulness (forgot to turn in EBT form and pay utility bill), irritability, worry about managing errands with both children; pt is coping with good support from grandma and husband; uncertainty about managing when he's back at work and she's gone home.  Duration of problem: Postpartum increase; Severity of problem:  moderately severe  Patient and/or Family's Strengths/Protective Factors: Social connections, Concrete supports in place (healthy food, safe environments, etc.), Sense of purpose, and  Physical Health (exercise, healthy diet, medication compliance, etc.)  Goals Addressed: Patient will:  Reduce symptoms of: anxiety, depression, and stress   Increase knowledge and/or ability of: stress reduction   Demonstrate ability to: Increase healthy adjustment to current life circumstances and Increase motivation to adhere to plan of care  Progress towards Goals: Ongoing  Interventions: Interventions utilized:  Solution-Focused Strategies, Psychoeducation and/or Health Education, and Link to [employer] Standardized Assessments completed: GAD-7 and PHQ 9  Patient and/or Family Response: Patient agrees with treatment plan.   Assessment: Patient currently experiencing Generalized anxiety disorder; Psychosocial stress.   Patient may benefit from continued therapeutic interventions.  Plan: Follow up with behavioral health clinician on : Two weeks Behavioral recommendations:  -Continue prioritizing healthy self-care (regular meals, adequate rest; allowing practical help from supportive friends and family) until at least postpartum medical appointment -Consider new mom support group as needed at either www.postpartum.net or www.conehealthybaby.com  -Brainstorm list of gas stations and grocery stores that have drive through, pay-at-pump, delivery options; use these locations as priority, to limit times needed to bring children into stores, etc.  -Practice using wrap for wearing baby, to use during in-person shopping outings, for hands free for 29yo -Accept referral to [employer] Referral(s): Integrated [employer] (In Clinic)  I discussed the assessment and treatment plan with the patient and/or parent/guardian. They were provided an opportunity to ask questions and all were answered. They agreed with the plan and demonstrated an understanding of the instructions.   They were advised to call back or seek an in-person evaluation if the symptoms worsen or if the  condition fails to improve as anticipated.  [name], LCSW     03/04/2023   11:19 AM 02/13/2023   10:00 AM 12/24/2022   11:40 AM 08/31/2019    8:30 AM  Depression screen PHQ 2/9  Decreased Interest 1 0 3 2  Down, Depressed, Hopeless 0 0 1 3  PHQ - 2 Score 1 0 4 5  Altered sleeping 2  0 2  Tired, decreased energy 3  3 2   Change in appetite 0  0 0  Feeling bad or failure about yourself  0  0 1  Trouble concentrating 1  1 0  Moving slowly or fidgety/restless 0  0 0  Suicidal thoughts 0  0 0  PHQ-9 Score 7  8 10       03/04/2023   11:20 AM 12/24/2022   11:41 AM 08/31/2019    8:30 AM  GAD 7 : Generalized Anxiety Score  Nervous, Anxious, on Edge 2 3 3   Control/stop worrying 3 3 3   Worry too much - different things 3 3 3   Trouble relaxing 2 2 2   Restless 1 0 0  Easily annoyed or irritable 1 0 1  Afraid - awful might happen 3 2 3   Total GAD 7 Score 15 13 15

## 2023-03-31 NOTE — Lactation Note (Signed)
This note was copied from a baby's chart.  NICU Lactation Consultation Note  Patient Name: Christina [name] Date: 03/31/2023 Age:28 days  Reason for consult: Follow-up assessment; NICU baby; Term; Nipple pain/trauma; Maternal endocrine disorder Type of Endocrine Disorder?: Diabetes  SUBJECTIVE  LC in to visit with P2 Mom of term baby on day of discharge.  Mom with some tenderness on right nipple.  Baby had just fed for 8 mins on left side, swallows heard, breast softened.  After a few minutes, baby latched using cross cradle hold and assisted Mom to use a U hold to support her right breast.  Baby latched deeply and sucked with nutritive sucking.  Mom's pain dissipated and she felt comfortable for rest of feeding.    Mom asking about pumping.  LC recommended she have OP lactation F/U.  Mom will pump only if baby isn't on the breast and offered supplement of EBM.  Mom encouraged of offer the breast often with feeding cues.      OBJECTIVE Infant data: Ad lib Infant feeding assessment Scale for Readiness: 1 Scale for Quality: 1   Maternal data: G3P1011  Vaginal, Spontaneous Pumping frequency: PRN if baby is supplemented Flange Size: 24  WIC Program: Yes WIC Referral Sent?: Yes [place] county[place]  ASSESSMENT Infant: LATCH Documentation Latch: 2 Audible Swallowing: 2 Type of Nipple: 2 Comfort (Breast/Nipple): 2 Hold (Positioning): 2 LATCH Score: 10  Feeding Status: Ad lib  Maternal: No data recorded INTERVENTIONS/PLAN Interventions: Interventions: Breast feeding basics reviewed; Skin to skin; Breast massage; Hand express; Coconut oil; DEBP; Education Discharge Education: Engorgement and breast care; Outpatient recommendation; Outpatient Epic message sent Tools: Pump; Flanges Pump Education: Setup, frequency, and cleaning  Plan: Consult Status: Complete NICU Follow-up type: Baby's discharge   Christina [name] 03/31/2023, 10:07 AM

## 2023-04-02 ENCOUNTER — Ambulatory Visit (INDEPENDENT_AMBULATORY_CARE_PROVIDER_SITE_OTHER): Payer: Medicaid Other | Admitting: Family Medicine

## 2023-04-02 ENCOUNTER — Encounter: Payer: Self-pay | Admitting: Family Medicine

## 2023-04-02 VITALS — BP 118/72 | HR 95 | Ht 63.0 in | Wt 193.0 lb

## 2023-04-02 DIAGNOSIS — Z30431 Encounter for routine checking of intrauterine contraceptive device: Secondary | ICD-10-CM | POA: Diagnosis not present

## 2023-04-02 NOTE — Progress Notes (Signed)
   Subjective:    Patient ID: Christina [name], female    DOB: 06/28/1996, 27 y.o.   MRN: [id number]  HPI Patient seen for IUD check.  She had post placental IUD placed there is no feeling strings from the cervix.  She felt the strings protruding from the cervix.  She did trim them.   One week postpartum- breastfeeding.  Edinburgh score 7. Armandina Stammer RN  Review of Systems     Objective:   Physical Exam Vitals reviewed. Exam conducted with a chaperone present.  Constitutional:      Appearance: Normal appearance.  Abdominal:     Hernia: There is no hernia in the left inguinal area or right inguinal area.  Genitourinary:    Labia:        Right: No rash or tenderness.        Left: No rash or tenderness.      Lymphadenopathy:     Lower Body: No right inguinal adenopathy. No left inguinal adenopathy.  Neurological:     Mental Status: She is alert.  Psychiatric:        Mood and Affect: Mood normal.        Behavior: Behavior normal.        Thought Content: Thought content normal.        Judgment: Judgment normal.        Assessment & Plan:   1. IUD check up Strings trimmed. F/u as needed.

## 2023-04-09 ENCOUNTER — Ambulatory Visit (INDEPENDENT_AMBULATORY_CARE_PROVIDER_SITE_OTHER): Payer: Medicaid Other | Admitting: Clinical

## 2023-04-09 DIAGNOSIS — F411 Generalized anxiety disorder: Secondary | ICD-10-CM

## 2023-04-09 DIAGNOSIS — Z658 Other specified problems related to psychosocial circumstances: Secondary | ICD-10-CM

## 2023-04-09 NOTE — Patient Instructions (Signed)
[facility] [address] [place], [place] [address] [phone] (main office) [phone] [place] office)  [facility] [employer] [facility] [address] [place], [place] [address]    Regular Hours:  Monday -Thursday 9am-4pm Fridays 9am-12 noon *Note that [facility] hours may change, may call [phone] to confirm

## 2023-04-17 NOTE — BH Specialist Note (Signed)
Integrated Behavioral Health via Telemedicine Visit  04/30/2023 Christina [name] [id number]  Number of Integrated Behavioral Health Clinician visits: 4- Fourth Visit  Session Start time: 1312   Session End time: 1352  Total time in minutes: 40   Referring Provider: Wynelle [name], [name] Patient/Family location: Home [facility] Provider location: [facility]  All persons participating in visit: [name] and [facility]   Types of Service: Individual psychotherapy and Video visit  I connected with Christina [name] and/or Christina [name]  n/a  via  Telephone or Video Enabled Telemedicine Application  (Video is Caregility application) and verified that I am speaking with the correct person using two identifiers. Discussed confidentiality: Yes   I discussed the limitations of telemedicine and the availability of in person appointments.  Discussed there is a possibility of technology failure and discussed alternative modes of communication if that failure occurs.  I discussed that engaging in this telemedicine visit, they consent to the provision of behavioral healthcare and the services will be billed under their insurance.  Patient and/or legal guardian expressed understanding and consented to Telemedicine visit: Yes   Presenting Concerns: Patient and/or family reports the following symptoms/concerns: Processing struggle with self-esteem regarding body image after two children; feeling overwhelmed at times.  Duration of problem: Postpartum; Severity of problem: moderate  Patient and/or Family's Strengths/Protective Factors: Social connections, Concrete supports in place (healthy food, safe environments, etc.), and Sense of purpose  Goals Addressed: Patient will:  Reduce symptoms of: anxiety, depression, and stress   Increase knowledge and/or ability of: healthy habits   Demonstrate ability to: Increase healthy  adjustment to current life circumstances and Increase motivation to adhere to plan of care  Progress towards Goals: Ongoing  Interventions: Interventions utilized:  Psychoeducation and/or Health Education and Supportive Reflection Standardized Assessments completed: Not Needed  Patient and/or Family Response: Patient agrees with treatment plan.   Assessment: Patient currently experiencing Generalized anxiety disorder; Psychosocial stress.   Patient may benefit from continued therapeutic interventions.  Plan: Follow up with behavioral health clinician on : Two weeks Behavioral recommendations:  -Continue prioritizing healthy self-care (regular meals, adequate rest; allowing practical help from supportive friends and family) until at least postpartum medical appointment tomorrow -Consider new mom support group as needed at either www.postpartum.net or www.[email or web address].com  -Daily reminder to have kindness towards self in the midst of current struggle. You are not alone. This is hard, but will get easier.  -Continue using alternate ways to shop (pick up groceries; free shipping to house non-food essentials, etc.); wearing baby to do in-person errands/shopping for hands free for 2yo -Consider implementing flexible schedule to establish routine and expectations (ex. Bedtime routine, etc. As discussed) Referral(s): Integrated [employer] (In Clinic) and [employer]:  new mom support  I discussed the assessment and treatment plan with the patient and/or parent/guardian. They were provided an opportunity to ask questions and all were answered. They agreed with the plan and demonstrated an understanding of the instructions.   They were advised to call back or seek an in-person evaluation if the symptoms worsen or if the condition fails to improve as anticipated.  [name] Christina Liberatore, LCSW     03/04/2023   11:19 AM 02/13/2023   10:00 AM 12/24/2022   11:40 AM 08/31/2019     8:30 AM  Depression screen PHQ 2/9  Decreased Interest 1 0 3 2  Down, Depressed, Hopeless 0 0 1 3  PHQ - 2  Score 1 0 4 5  Altered sleeping 2  0 2  Tired, decreased energy 3  3 2   Change in appetite 0  0 0  Feeling bad or failure about yourself  0  0 1  Trouble concentrating 1  1 0  Moving slowly or fidgety/restless 0  0 0  Suicidal thoughts 0  0 0  PHQ-9 Score 7  8 10       03/04/2023   11:20 AM 12/24/2022   11:41 AM 08/31/2019    8:30 AM  GAD 7 : Generalized Anxiety Score  Nervous, Anxious, on Edge 2 3 3   Control/stop worrying 3 3 3   Worry too much - different things 3 3 3   Trouble relaxing 2 2 2   Restless 1 0 0  Easily annoyed or irritable 1 0 1  Afraid - awful might happen 3 2 3   Total GAD 7 Score 15 13 15

## 2023-04-30 ENCOUNTER — Ambulatory Visit (INDEPENDENT_AMBULATORY_CARE_PROVIDER_SITE_OTHER): Payer: Medicaid Other | Admitting: Clinical

## 2023-04-30 DIAGNOSIS — F411 Generalized anxiety disorder: Secondary | ICD-10-CM

## 2023-04-30 DIAGNOSIS — Z658 Other specified problems related to psychosocial circumstances: Secondary | ICD-10-CM

## 2023-05-01 ENCOUNTER — Other Ambulatory Visit (HOSPITAL_COMMUNITY)
Admission: RE | Admit: 2023-05-01 | Discharge: 2023-05-01 | Disposition: A | Discharge status: Home / Self Care | Payer: Medicaid Other | Source: Ambulatory Visit | Attending: Family Medicine | Admitting: Family Medicine

## 2023-05-01 ENCOUNTER — Ambulatory Visit (INDEPENDENT_AMBULATORY_CARE_PROVIDER_SITE_OTHER): Payer: Medicaid Other | Admitting: Family Medicine

## 2023-05-01 ENCOUNTER — Encounter: Payer: Self-pay | Admitting: Family Medicine

## 2023-05-01 DIAGNOSIS — Z124 Encounter for screening for malignant neoplasm of cervix: Secondary | ICD-10-CM | POA: Diagnosis not present

## 2023-05-01 DIAGNOSIS — O24419 Gestational diabetes mellitus in pregnancy, unspecified control: Secondary | ICD-10-CM
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
## 2023-05-01 NOTE — Addendum Note (Signed)
Addended by: Mikey Bussing on: 05/01/2023 04:18 PM   Modules accepted: Orders

## 2023-05-01 NOTE — Progress Notes (Signed)
Post Partum Visit Note  Christina [name] is a 27 y.o. G61P1011 female who presents for a postpartum visit. She is 5 weeks postpartum following a VBAC.  I have fully reviewed the prenatal and intrapartum course. The delivery was at 39 gestational weeks.  Anesthesia: epidural. Postpartum course has been normal. Baby is doing well. Baby is feeding by breast. Bleeding no bleeding. Bowel function is normal. Bladder function is normal. Patient is not sexually active. Contraception method is IUD. Postpartum depression screening: negative.   Upstream - 05/01/23 1332       Pregnancy Intention Screening   Does the patient want to become pregnant in the next year? No    Does the patient's partner want to become pregnant in the next year? No    Would the patient like to discuss contraceptive options today? No      Contraception Wrap Up   Current Method IUD or IUS    End Method IUD or IUS    Contraception Counseling Provided No    How was the end contraceptive method provided? N/A            The pregnancy intention screening data noted above was reviewed. Potential methods of contraception were discussed. The patient elected to proceed with IUD or IUS.   Edinburgh Postnatal Depression Scale - 05/01/23 1330       Edinburgh Postnatal Depression Scale:  In the Past 7 Days   I have been able to laugh and see the funny side of things. 0    I have looked forward with enjoyment to things. 0    I have blamed myself unnecessarily when things went wrong. 1    I have been anxious or worried for no good reason. 2    I have felt scared or panicky for no good reason. 0    Things have been getting on top of me. 2    I have been so unhappy that I have had difficulty sleeping. 0    I have felt sad or miserable. 0    I have been so unhappy that I have been crying. 0    The thought of harming myself has occurred to me. 0    Edinburgh Postnatal Depression Scale Total 5             Health  Maintenance Due  Topic Date Due   COVID-19 Vaccine (1) Never done   PAP-Cervical Cytology Screening  Never done   PAP SMEAR-Modifier  Never done    The following portions of the patient's history were reviewed and updated as appropriate: allergies, current medications, past family history, past medical history, past social history, past surgical history, and problem list.  Review of Systems Pertinent items are noted in HPI.  Objective:  Wt 200 lb (90.7 kg)   LMP 06/23/2022 (Approximate)   Breastfeeding Yes   BMI 35.43 kg/m    General:  alert, cooperative, and no distress   Breasts:  not indicated  Lungs: clear to auscultation bilaterally  Heart:  regular rate and rhythm, S1, S2 normal, no murmur, click, rub or gallop  Abdomen: soft, non-tender; bowel sounds normal; no masses,  no organomegaly   Wound N/a  GU exam:  normal. IUD strings seen       Assessment:   1. Postpartum care and examination  2. Gestational diabetes mellitus (GDM), antepartum, gestational diabetes method of control unspecified 2hr GTT    Plan:   Essential components of care per ACOG recommendations:  1.  Mood and well being: Patient with negative depression screening today. Reviewed local resources for support.  - Patient tobacco use? No.   - hx of drug use? No.    2. Infant care and feeding:  -Patient currently breastmilk feeding? Yes. Reviewed importance of draining breast regularly to support lactation.  -Social determinants of health (SDOH) reviewed in EPIC. No concerns  3. Sexuality, contraception and birth spacing - Patient does not want a pregnancy in the next year.  - Reviewed reproductive life planning. Reviewed contraceptive methods based on pt preferences and effectiveness.  Patient desired IUD or IUS today.   - Discussed birth spacing of 18 months  4. Sleep and fatigue -Encouraged family/partner/community support of 4 hrs of uninterrupted sleep to help with mood and fatigue  5.  Physical Recovery  - Discussed patients delivery and complications. She describes her labor as good. - Patient had a Vaginal, no problems at delivery. Patient had a 1st degree laceration. Perineal healing reviewed. Patient expressed understanding - Patient has urinary incontinence? No. - Patient is safe to resume physical and sexual activity  6.  Health Maintenance - HM due items addressed Yes - Last pap smear No results found for: "DIAGPAP" Pap smear done at today's visit.  -Breast Cancer screening indicated? No.   7. Chronic Disease/Pregnancy Condition follow up: None  - PCP follow up  Levie Heritage, DO Center for [facility], [facility]

## 2023-05-05 ENCOUNTER — Other Ambulatory Visit: Payer: Medicaid Other

## 2023-05-07 LAB — CYTOLOGY - PAP: Diagnosis: NEGATIVE

## 2023-05-08 ENCOUNTER — Other Ambulatory Visit: Payer: Medicaid Other

## 2023-05-09 ENCOUNTER — Other Ambulatory Visit (INDEPENDENT_AMBULATORY_CARE_PROVIDER_SITE_OTHER): Payer: Medicaid Other

## 2023-05-09 DIAGNOSIS — Z8632 Personal history of gestational diabetes: Secondary | ICD-10-CM

## 2023-05-09 NOTE — Progress Notes (Signed)
Patient presents for postpartum 2hr GTT. Patient was sent to the lab to have labs drawn. Kailin Principato l Manessa Buley, CMA

## 2023-05-09 NOTE — Addendum Note (Signed)
Addended by: Leola Brazil on: 05/09/2023 09:07 AM   Modules accepted: Orders

## 2023-05-10 LAB — GLUCOSE TOLERANCE, 2 HOURS
Glucose, 2 hour: 119 mg/dL (ref 70–139)
Glucose, GTT - Fasting: 93 mg/dL (ref 70–99)

## 2023-05-12 ENCOUNTER — Encounter: Payer: Self-pay | Admitting: Family Medicine

## 2023-05-14 ENCOUNTER — Ambulatory Visit (INDEPENDENT_AMBULATORY_CARE_PROVIDER_SITE_OTHER): Payer: Medicaid Other | Admitting: Clinical

## 2023-05-14 DIAGNOSIS — Z658 Other specified problems related to psychosocial circumstances: Secondary | ICD-10-CM

## 2023-05-14 DIAGNOSIS — F411 Generalized anxiety disorder: Secondary | ICD-10-CM

## 2023-05-14 NOTE — Patient Instructions (Signed)
Center for [facility] at [facility] [address] [place], [place] [address] [phone] (main office) [phone] [facility] office)  [facility]  [address] [place], [place] [address] [phone] [email or web address]  [facility]:  [address], 2nd floor, [place], [place] [address] [phone])  Main line [phone]  [place] location **Parking is available in the [address] parking deck.  [place]:  [address] [place], [place] [address] [phone]) Main line [phone]  [place] location  **Located on the backside of the [facility] [place]. Limited parking is available off of [address]  [place] hours: Monday -Friday 8:30am-4:30pm  [email or web address]    If immediate emergency, call 9-1-1, or [facility] [place] 24 hour crisis hotline at: (719)240-0127  [facility] [address], [place], [place] [address] [phone] **temporary housing for victims of domestic violence  [place]:  [facility] [employer]  [address] [place], [place] [address] [phone]  [email or web address]  Monday-Friday, 8:30am-5:00pm

## 2023-05-21 NOTE — BH Specialist Note (Signed)
Integrated Behavioral Health via Telemedicine Visit  06/04/2023 Christina [name] [id number]  Number of Integrated Behavioral Health Clinician visits: 6-Sixth Visit  Session Start time: 1355   Session End time: 1426  Total time in minutes: 31   Referring Provider: Wynelle Bourgeois, [name] Patient/Family location: Home [facility] Provider location: [name] [name]  All persons participating in visit: [name] and Christina [name] Christina [name]   Types of Service: Individual psychotherapy and Video visit  I connected with Christina [name] and/or Christina [name]  n/a  via  Telephone or Video Enabled Telemedicine Application  (Video is Caregility application) and verified that I am speaking with the correct person using two identifiers. Discussed confidentiality: Yes   I discussed the limitations of telemedicine and the availability of in person appointments.  Discussed there is a possibility of technology failure and discussed alternative modes of communication if that failure occurs.  I discussed that engaging in this telemedicine visit, they consent to the provision of behavioral healthcare and the services will be billed under their insurance.  Patient and/or legal guardian expressed understanding and consented to Telemedicine visit: Yes   Presenting Concerns: Patient and/or family reports the following symptoms/concerns: Primary concern today is financial stress, due to getting behind on bills; considering options for home ownership in the future; interested in starting medication for anxiety.  Duration of problem: Increase past month; Severity of problem: moderate  Patient and/or Family's Strengths/Protective Factors: Social connections, Concrete supports in place (healthy food, safe environments, etc.), Sense of purpose, and Physical Health (exercise, healthy diet, medication compliance, etc.)  Goals Addressed: Patient will:  Reduce  symptoms of: anxiety and stress   Increase knowledge and/or ability of: stress reduction   Demonstrate ability to: Increase adequate support systems for patient/family  Progress towards Goals: Ongoing  Interventions: Interventions utilized:  Link to [employer] and Supportive Reflection Standardized Assessments completed: Not Needed  Patient and/or Family Response: Patient agrees with treatment plan.   Assessment: Patient currently experiencing Generalized anxiety disorder; Psychosocial stress.   Patient may benefit from continued therapeutic intervention  .  Plan: Follow up with behavioral health clinician on : Three weeks Behavioral recommendations:  -Continue to consider blocking all of ex's family on social media; consider plan to visit [facility] (talk to financial and career counselor about options) -Consider housing resources on After Visit Summary -Continue daily kindness to self and daily healthy self-care Referral(s): Integrated Art gallery manager (In Clinic) and [employer]:  Housing  I discussed the assessment and treatment plan with the patient and/or parent/guardian. They were provided an opportunity to ask questions and all were answered. They agreed with the plan and demonstrated an understanding of the instructions.   They were advised to call back or seek an in-person evaluation if the symptoms worsen or if the condition fails to improve as anticipated.  [name] Felice Hope, LCSW

## 2023-05-22 ENCOUNTER — Ambulatory Visit (INDEPENDENT_AMBULATORY_CARE_PROVIDER_SITE_OTHER): Payer: Medicaid Other | Admitting: Family Medicine

## 2023-05-22 ENCOUNTER — Encounter: Payer: Self-pay | Admitting: Family Medicine

## 2023-05-22 VITALS — BP 118/52 | HR 93 | Wt 203.0 lb

## 2023-05-22 DIAGNOSIS — N941 Unspecified dyspareunia: Secondary | ICD-10-CM

## 2023-05-22 NOTE — Progress Notes (Signed)
   Subjective:    Patient ID: Christina [name], female    DOB: 03/18/1996, 27 y.o.   MRN: [id number]  HPI Patient seen for dyspareunia over the past few weeks.  She is about 2 months postpartum from a vaginal delivery with a first-degree perineal tear/repair.  She also had a Skene's gland cyst that ruptured during delivery.  She states that she has 2 different types of pain.  She first has some discomfort with initial active penetration, but that has sharp needlelike pain in addition to that.  That needlelike pain is fairly uncomfortable and feels like it is vaginal in nature.  She frequently has to stop intercourse because of the discomfort that she experiences.  This occurs with every sexual encounter.  No vaginal dryness or abnormal discharge.  Review of Systems     Objective:   Physical Exam Vitals reviewed.  Constitutional:      Appearance: Normal appearance.  Genitourinary:    Labia:        Right: No rash, tenderness or lesion.        Left: No rash, tenderness or lesion.      Urethra: No prolapse, urethral pain, urethral swelling or urethral lesion.     Vagina: Normal. No signs of injury and foreign body. No vaginal discharge, erythema, tenderness or bleeding.          Comments: On internal exam, unable to reproduce sharp pain with digital palpation. Skin:    General: Skin is warm and dry.     Capillary Refill: Capillary refill takes less than 2 seconds.  Neurological:     General: No focal deficit present.     Mental Status: She is alert.  Psychiatric:        Mood and Affect: Mood normal.        Behavior: Behavior normal.        Thought Content: Thought content normal.        Judgment: Judgment normal.           Assessment & Plan:  1. Dyspareunia in female Will refer to pelvic floor PT.  We did discuss that pain after vaginal delivery can take several months to completely resolve.  At this point, does not appear that the IUD strings are causing the pain that the  patient is experiencing.  The tenderness/pain at the right Skene's gland appears to be similar to the pain that the patient is experiencing.  I do not see any open areas to that at this point.  May need to see urogyn if continues to have pain despite pelvic floor PT evaluation.

## 2023-06-04 ENCOUNTER — Ambulatory Visit (INDEPENDENT_AMBULATORY_CARE_PROVIDER_SITE_OTHER): Payer: Medicaid Other | Admitting: Clinical

## 2023-06-04 DIAGNOSIS — Z658 Other specified problems related to psychosocial circumstances: Secondary | ICD-10-CM

## 2023-06-04 DIAGNOSIS — F411 Generalized anxiety disorder: Secondary | ICD-10-CM | POA: Diagnosis not present

## 2023-06-04 NOTE — Patient Instructions (Signed)
[facility] [address] [place], [place] [address] [phone] (main office) [phone] (Breleigh Carpino's office)  Housing Resources                    [facility] (serves [place], [place], [place], [place], [place], [place], [place], [place], [place], [place], [place], [place], and [place] counties) [address], [place], [place] [address] [phone] www.ptrc.org  **Rental assistance, Home Rehabilitation,Weatherization Assistance Program, Heating Appliance Repair and Replacement Program, Housing Voucher Program  [facility] and Community Studies: Emergency Rental Assistance Resources to residents of [place], [place], and [place] Make sure you have your documents ready, including:  (Household income verification: 2 months pay stubs, unemployment/social security award letter, statement of no income for all household members over 18) Photo ID for all household members over 18 Utility Bill/Rent Ledger/Lease: must show past due amount for utilities/rent, or the rental agreement if rent is current 2. Start your application online or by paper (in English or Spanish) at:     [email or web address]  3. Once you have completed the online application, you will get an email confirmation message from the county. Expect to hear back by phone or email at least 6-10 weeks from submitting your application.  4. While you wait:  Call [phone] to check in on your application Let your landlord know that you've applied. Your landlord will be asked to submit documents (W-9) during this application process. Payments will be made directly to the landlord/property management company and utility company Rent or utility assistance for [place], and [place] Apply at [email or web address] Questions? Call or email Renee at  [phone] or [email or web address]   Eviction Mediation Program: The HOPE Program Https://www.rebuild.[place].gov/hope-program HOPE Progam serves low-income renters in 88 [place] counties, defined as less than or equal to 80% of the area median income for the county where the renter lives. In the following 12 counties, you should apply to your local rent and utility assistance program INSTEAD OF the HOPE Program: [place], [place], [place], [place], [place], [place], [place], [place], [place], [place], [place], [place]  If you live outside of [place], contact HOPE call center at [phone] to talk to a Program Representative Monday-Friday, 8am-5pm Note that Native American tribes also received federal funding for rent and utility assistance programs. Recognized members of the following tribes will be served by programs managed by tribal governments, including: Eastern Band of [place] Indians, [place] Tribe, [place] Indian Tribe, [place] Tribe of  and [place] Tribe Eviction Mediation Coordinator, Renee, [phone] [email or web address] Housing Resources Navigator, Stefanie, [phone] [email or web address]   Housing Resources [place]  [facility] [place] [address], [place], [place] [address] [phone]) [phone] www.gha-[place].org   [place] [facility] [address], [place], [place] [address] [phone]) [phone] www.gsohc.org **Programs include: Foreclosure Prevention and Housing Counseling, Healthy Homes/Tenant Advocacy, Homeless Prevention and Housing Assistance  [facility] [address], [place], [place] [address] [phone]) [phone] www.[place].gov **housing applications/recertification; tax payment relief/exemption under specific qualifications  [facility] [address] [place] [address], [place], [place] [address] www.onlineg[place].com/~maryshouse **transitional housing for  women in recovery who have minor children or are pregnant  [facility] [address], [place], [place] [address] www.ywcagsonc.org  **emergency shelter and support services for families facing homelessness  [facility] [address], ,  [address] [phone]) [phone] www.youthfocus.org **transitional housing to pregnant women; emergency housing for youth who have run away, are experiencing a family   crisis, are victims of abuse or neglect, or are homeless  [facility] [address], [place], [place] [address] [phone] ircgso.org **Drop-in center for people experiencing homelessness; overnight warming center when temperature is 25 degrees or below  [facility] [employer] [address] [place], [place], [place] [address] [phone] https://reentrystaffingagency.org/ **help with affordable housing to people experiencing homelessness or unemployment due to incarceration  [facility] [address] [place], [place] [address] [phone] www.greensborourbanministry.org  **emergency and transitional housing, rent/mortgage assistance, utility assistance  [facility] [address] [place], [place] [address] [phone] www.salvationarmyofgreensboro.org **emergency and transitional housing  [employer] for [employer] [address], [place], [place] [address] [phone] Www.habitatgreensboro.[facility]   [employer] [address], [place], [place] [address] [phone] https://chshousing.org **Home Ownership/Affordable Housing Program and [facility]  [facility] [address], [place], [place] [address] [phone] PaidValue.com.cy **home buyer education courses, foreclosure prevention  [facility] [address] [place], [place] [address] [phone] DefMagazine.is **Environmental Exposure Assessment (investigation of homes where either children or pregnant women with a confirmed elevated blood lead level reside)  [facility] [address] Nat Math [place], [place] [address] [phone] ShowReturn.ca **Home Expense Assistance/Repairs Program; offers home accessibility updates, such as ramps or bars in the bathroom  [facility] [address] [place], [place] [address] [phone] https://www.self-help.org/locations/[place]-branch **Offers credit-building and banking services to people unable to use traditional banking   Housing Resources [facility]  [facility] Authority- [place] [facility] [address] [place]inda Quest [place], [place] [address] [phone] ChatRepair.pl   [facility] [address], [place], [place] [address] [phone]  WrestlingMonthly.pl **housing applications/recertification, emergency and transitional housing  [employer] [address] [place], [place] [address] [phone] www.odm-hp.org  **emergency and permanent housing; rent/mortgage payment assistance  [employer] for [employer], [place] and [place] [address], [place], [place] [address] [phone] https://russell-walls.com/  Family Services of the [place], [employer] [address] [place], [place], [place] [address] www.familyservice-[place]mont.org **emergency shelter for victims of domestic violence and sexual assault  [facility] [address] [place], [place] [address] [phone] www.senior-resources-[place].org **Home expense assistance/repairs for older adults

## 2023-06-11 ENCOUNTER — Encounter: Payer: Self-pay | Admitting: Family Medicine

## 2023-06-16 ENCOUNTER — Telehealth: Payer: Self-pay | Admitting: Clinical

## 2023-06-16 NOTE — BH Specialist Note (Unsigned)
Integrated Behavioral Health via Telemedicine Visit  06/25/2023 Christina [name] [id number]  Number of Integrated Behavioral Health Clinician visits: Additional Visit  Session Start time: 1554   Session End time: 1630  Total time in minutes: 36   Referring Provider: Wynelle Bourgeois, [name] Patient/Family location: Home [facility] Provider location: [facility]  All persons participating in visit: [name] and [facility] Dredyn Gubbels    Types of Service: Individual psychotherapy and Video visit  I connected with [name] and/or [name]  n/a  via  Telephone or Video Enabled Telemedicine Application  (Video is Caregility application) and verified that I am speaking with the correct person using two identifiers. Discussed confidentiality: Yes   I discussed the limitations of telemedicine and the availability of in person appointments.  Discussed there is a possibility of technology failure and discussed alternative modes of communication if that failure occurs.  I discussed that engaging in this telemedicine visit, they consent to the provision of behavioral healthcare and the services will be billed under their insurance.  Patient and/or legal guardian expressed understanding and consented to Telemedicine visit: Yes   Presenting Concerns: Patient and/or family reports the following symptoms/concerns: Worry over potential eviction and other life stress; plans to pick up and begin taking Zoloft this week.  Duration of problem: Ongoing; Severity of problem: moderate  Patient and/or Family's Strengths/Protective Factors: Social connections, Concrete supports in place (healthy food, safe environments, etc.), Sense of purpose, and Physical Health (exercise, healthy diet, medication compliance, etc.)  Goals Addressed: Patient will:  Reduce symptoms of: anxiety and stress   Increase knowledge and/or ability of:  stress reduction   Demonstrate ability to: Increase healthy adjustment to current life circumstances  Progress towards Goals: Ongoing  Interventions: Interventions utilized:  Solution-Focused Strategies and Supportive Reflection Standardized Assessments completed: Not Needed  Patient and/or Family Response: Patient agrees with treatment plan.   Assessment: Patient currently experiencing Generalized anxiety disorder; Psychosocial stress.   Patient may benefit from continued therapeutic intervention .  Plan: Follow up with behavioral health clinician on : One week Behavioral recommendations:  -Continue working with all housing resources available -Continue daily kindness and healthy self-care during this time -Begin taking Zoloft as prescribed Referral(s): Integrated [employer] (In Clinic)  I discussed the assessment and treatment plan with the patient and/or parent/guardian. They were provided an opportunity to ask questions and all were answered. They agreed with the plan and demonstrated an understanding of the instructions.   They were advised to call back or seek an in-person evaluation if the symptoms worsen or if the condition fails to improve as anticipated.  [name] Shaquanna Lycan, LCSW

## 2023-06-16 NOTE — Telephone Encounter (Signed)
Pt called, requesting to talk due to a "really bad mental health day", attributes to relationship conflict (differing values, lack of clear communication and expectations) along with financial stress. Pt agrees to follow up scheduled on 06-25-23 virtual appointment.

## 2023-06-23 MED ORDER — SERTRALINE HCL 50 MG PO TABS: 50.0000 mg | ORAL_TABLET | Freq: Every day | ORAL | 3 refills | Status: DC

## 2023-06-25 ENCOUNTER — Ambulatory Visit (INDEPENDENT_AMBULATORY_CARE_PROVIDER_SITE_OTHER): Payer: Medicaid Other | Admitting: Clinical

## 2023-06-25 DIAGNOSIS — F411 Generalized anxiety disorder: Secondary | ICD-10-CM | POA: Diagnosis not present

## 2023-06-25 DIAGNOSIS — Z658 Other specified problems related to psychosocial circumstances: Secondary | ICD-10-CM

## 2023-06-30 NOTE — BH Specialist Note (Unsigned)
Integrated Behavioral Health via Telemedicine Visit  07/02/2023 Christina [name] [id number]  Number of Integrated Behavioral Health Clinician visits: Additional Visit  Session Start time: 1418   Session End time: 1500  Total time in minutes: 42   Referring Provider: Wynelle Bourgeois, [name] Patient/Family location: Home [facility] Provider location: [facility]  All persons participating in visit: [name] and [facility] Arvin Abello   Types of Service: Individual psychotherapy and Video visit  I connected with [name] and/or [name]  n/a  via  Telephone or Video Enabled Telemedicine Application  (Video is Caregility application) and verified that I am speaking with the correct person using two identifiers. Discussed confidentiality: Yes   I discussed the limitations of telemedicine and the availability of in person appointments.  Discussed there is a possibility of technology failure and discussed alternative modes of communication if that failure occurs.  I discussed that engaging in this telemedicine visit, they consent to the provision of behavioral healthcare and the services will be billed under their insurance.  Patient and/or legal guardian expressed understanding and consented to Telemedicine visit: Yes   Presenting Concerns: Patient and/or family reports the following symptoms/concerns: Began taking Zoloft at half (25mg ) with mildly lowered appetite; struggling with maintaining connection to specific friends with differing life stages/values.  Duration of problem: Ongoing; Severity of problem: moderate  Patient and/or Family's Strengths/Protective Factors: Social connections, Concrete supports in place (healthy food, safe environments, etc.), Sense of purpose, and Physical Health (exercise, healthy diet, medication compliance, etc.)  Goals Addressed: Patient will:  Reduce symptoms of: anxiety and  stress   Increase knowledge and/or ability of: stress reduction   Demonstrate ability to: Increase healthy adjustment to current life circumstances  Progress towards Goals: Ongoing  Interventions: Interventions utilized:  Medication Monitoring and Supportive Reflection Standardized Assessments completed: Not Needed  Patient and/or Family Response: Patient agrees with treatment plan.   Assessment: Patient currently experiencing Generalized anxiety disorder; Psychosocial stress.   Patient may benefit from continued therapeutic intervention  .  Plan: Follow up with behavioral health clinician on : Two weeks Behavioral recommendations:  -Begin taking Zoloft at full dosage prescribed -Continue working with mother on rental payment plan post-loan - Consider increasing time with friends in same life stage; finding new ways to maintain connection with friends in different life stage/values, as discussed Referral(s): Integrated [employer] (In Clinic)  I discussed the assessment and treatment plan with the patient and/or parent/guardian. They were provided an opportunity to ask questions and all were answered. They agreed with the plan and demonstrated an understanding of the instructions.   They were advised to call back or seek an in-person evaluation if the symptoms worsen or if the condition fails to improve as anticipated.  [name] Jarelle Ates, LCSW     03/04/2023   11:19 AM 02/13/2023   10:00 AM 12/24/2022   11:40 AM 08/31/2019    8:30 AM  Depression screen PHQ 2/9  Decreased Interest 1 0 3 2  Down, Depressed, Hopeless 0 0 1 3  PHQ - 2 Score 1 0 4 5  Altered sleeping 2  0 2  Tired, decreased energy 3  3 2   Change in appetite 0  0 0  Feeling bad or failure about yourself  0  0 1  Trouble concentrating 1  1 0  Moving slowly or fidgety/restless 0  0 0  Suicidal thoughts 0  0 0  PHQ-9 Score 7  8 10      03/04/2023   11:20 AM 12/24/2022   11:41 AM 08/31/2019     8:30 AM  GAD 7 : Generalized Anxiety Score  Nervous, Anxious, on Edge 2 3 3   Control/stop worrying 3 3 3   Worry too much - different things 3 3 3   Trouble relaxing 2 2 2   Restless 1 0 0  Easily annoyed or irritable 1 0 1  Afraid - awful might happen 3 2 3   Total GAD 7 Score 15 13 15

## 2023-07-02 ENCOUNTER — Ambulatory Visit: Payer: Medicaid Other | Admitting: Clinical

## 2023-07-02 DIAGNOSIS — F411 Generalized anxiety disorder: Secondary | ICD-10-CM | POA: Diagnosis not present

## 2023-07-02 DIAGNOSIS — Z658 Other specified problems related to psychosocial circumstances: Secondary | ICD-10-CM

## 2023-07-02 NOTE — Patient Instructions (Signed)
[facility] [address] [place], [name] [address] [phone] (main office) [phone] (Jamie's office)   

## 2023-07-03 ENCOUNTER — Ambulatory Visit: Payer: Medicaid Other

## 2023-07-03 NOTE — BH Specialist Note (Signed)
Integrated Behavioral Health via Telemedicine Visit  07/16/2023 Christina [name] [id number]  Number of Integrated Behavioral Health Clinician visits: Additional Visit  Session Start time: 1456   Session End time: 1555  Total time in minutes: 59   Referring Provider: Wynelle Bourgeois, [name] Christina/Family location: Home [facility] Provider location: [facility]  All persons participating in visit: Christina [name] and [facility] Montey Ebel   Types of Service: Individual psychotherapy and Video visit  I connected with [name] and/or [name]  n/a  via  Telephone or Video Enabled Telemedicine Application  (Video is Caregility application) and verified that I am speaking with the correct person using two identifiers. Discussed confidentiality: Yes   I discussed the limitations of telemedicine and the availability of in person appointments.  Discussed there is a possibility of technology failure and discussed alternative modes of communication if that failure occurs.  I discussed that engaging in this telemedicine visit, they consent to the provision of behavioral healthcare and the services will be billed under their insurance.  Christina and/or legal guardian expressed understanding and consented to Telemedicine visit: Yes   Presenting Concerns: Christina and/or family reports the following symptoms/concerns: Loneliness (negative experiences meeting other mom friends) and difficulty finding time for self-care. Since beginning Zoloft, noticing more productive and decrease in over-thinking.  Duration of problem: Ongoing; Severity of problem: moderate  Christina and/or Family's Strengths/Protective Factors: Social connections, Concrete supports in place (healthy food, safe environments, etc.), Sense of purpose, and Physical Health (exercise, healthy diet, medication compliance, etc.)  Goals Addressed: Christina will:  Reduce  symptoms of: anxiety, depression, and stress    Demonstrate ability to: Increase healthy adjustment to current life circumstances, Increase adequate support systems for Christina/family, and Increase motivation to adhere to plan of care  Progress towards Goals: Ongoing  Interventions: Interventions utilized:  Solution-Focused Strategies Standardized Assessments completed: Not Needed  Christina and/or Family Response: Christina agrees with treatment plan.   Assessment: Christina currently experiencing Generalized anxiety disorder.   Christina may benefit from continued therapeutic intervention  .  Plan: Follow up with behavioral health clinician on : Two weeks Behavioral recommendations:  -Continue taking Zoloft as prescribed -Consider taking shower and self-care time in the early morning/before children wake up -Consider continue going to church on Sunday mornings with baby only; continue efforts to connect with other mothers there and at parks/etc -Consider continue  plan to ask sister to watch children at least once before school begins for date night with husband Referral(s): Integrated Art gallery [employer] (In Clinic) and [employer]:  New mom support  I discussed the assessment and treatment plan with the Christina and/or parent/guardian. They were provided an opportunity to ask questions and all were answered. They agreed with the plan and demonstrated an understanding of the instructions.   They were advised to call back or seek an in-person evaluation if the symptoms worsen or if the condition fails to improve as anticipated.  [name], LCSW     03/04/2023   11:19 AM 02/13/2023   10:00 AM 12/24/2022   11:40 AM 08/31/2019    8:30 AM  Depression screen PHQ 2/9  Decreased Interest 1 0 3 2  Down, Depressed, Hopeless 0 0 1 3  PHQ - 2 Score 1 0 4 5  Altered sleeping 2  0 2  Tired, decreased energy 3  3 2   Change in appetite 0  0 0  Feeling bad or failure about  yourself  0  0 1  Trouble concentrating 1  1 0  Moving slowly or fidgety/restless 0  0 0  Suicidal thoughts 0  0 0  PHQ-9 Score 7  8 10       03/04/2023   11:20 AM 12/24/2022   11:41 AM 08/31/2019    8:30 AM  GAD 7 : Generalized Anxiety Score  Nervous, Anxious, on Edge 2 3 3   Control/stop worrying 3 3 3   Worry too much - different things 3 3 3   Trouble relaxing 2 2 2   Restless 1 0 0  Easily annoyed or irritable 1 0 1  Afraid - awful might happen 3 2 3   Total GAD 7 Score 15 13 15

## 2023-07-16 ENCOUNTER — Ambulatory Visit (INDEPENDENT_AMBULATORY_CARE_PROVIDER_SITE_OTHER): Payer: Medicaid Other | Admitting: Clinical

## 2023-07-16 DIAGNOSIS — F411 Generalized anxiety disorder: Secondary | ICD-10-CM

## 2023-07-16 NOTE — Patient Instructions (Signed)
[facility] [address] [place], [place] [address] [phone] (main office) [phone] (Jamie's office)  New Parent Support Groups [email or web address]   

## 2023-07-17 NOTE — BH Specialist Note (Signed)
Integrated Behavioral Health via Telemedicine Visit  07/30/2023 Mariama Ednie [id number]  Number of Integrated Behavioral Health Clinician visits: Additional Visit  Session Start time: 1519   Session End time: 1610  Total time in minutes: 51   Referring Provider: Wynelle Bourgeois, [name] Patient/Family location: Home [facility] Provider location: [facility]  All persons participating in visit: Patient Dorcus Alejandre and [facility] Brinlynn Gorton   Types of Service: Individual psychotherapy and Video visit  I connected with [name] and/or [name]  n/a  via  Telephone or Video Enabled Telemedicine Application  (Video is Caregility application) and verified that I am speaking with the correct person using two identifiers. Discussed confidentiality: Yes   I discussed the limitations of telemedicine and the availability of in person appointments.  Discussed there is a possibility of technology failure and discussed alternative modes of communication if that failure occurs.  I discussed that engaging in this telemedicine visit, they consent to the provision of behavioral healthcare and the services will be billed under their insurance.  Patient and/or legal guardian expressed understanding and consented to Telemedicine visit: Yes   Presenting Concerns: Patient and/or family reports the following symptoms/concerns: Requests increase in Zoloft to manage anxiety; processing need for control over household order and cleanliness due to childhood with hoarder grandmother and relationship with mom; stress of going through current potty training and recognizing need to regulate her own emotions.  Duration of problem: Ongoing; Severity of problem: moderate  Patient and/or Family's Strengths/Protective Factors: Social connections, Concrete supports in place (healthy food, safe environments, etc.), and Sense of purpose  Goals  Addressed: Patient will:  Reduce symptoms of: anxiety and stress   Increase knowledge and/or ability of: stress reduction   Demonstrate ability to: Increase healthy adjustment to current life circumstances and Increase motivation to adhere to plan of care  Progress towards Goals: Ongoing  Interventions: Interventions utilized:  Solution-Focused Strategies and Supportive Reflection Standardized Assessments completed: Not Needed  Patient and/or Family Response: Patient agrees with treatment plan.   Assessment: Patient currently experiencing Generalized anxiety disorder; Psychosocial stress.   Patient may benefit from continued therapeutic intervention  .  Plan: Follow up with behavioral health clinician on : Two weeks Behavioral recommendations:  -Continue taking Zoloft as prescribed -Consider adding "reward" for successful potty training; limiting potty-sitting time to five minute stretches for less frustration for all parties involved -Kindness to self as a priority during this time, as discussed -Consider facing and naming emotion, rather than pushing it away, as soon as you notice it; verbalize it aloud or write it down.  Referral(s): Integrated [employer] (In Clinic)  I discussed the assessment and treatment plan with the patient and/or parent/guardian. They were provided an opportunity to ask questions and all were answered. They agreed with the plan and demonstrated an understanding of the instructions.   They were advised to call back or seek an in-person evaluation if the symptoms worsen or if the condition fails to improve as anticipated.  [name], LCSW     03/04/2023   11:19 AM 02/13/2023   10:00 AM 12/24/2022   11:40 AM 08/31/2019    8:30 AM  Depression screen PHQ 2/9  Decreased Interest 1 0 3 2  Down, Depressed, Hopeless 0 0 1 3  PHQ - 2 Score 1 0 4 5  Altered sleeping 2  0 2  Tired, decreased energy 3  3 2   Change in appetite 0  0 0   Feeling bad or failure about yourself  0  0 1  Trouble concentrating 1  1 0  Moving slowly or fidgety/restless 0  0 0  Suicidal thoughts 0  0 0  PHQ-9 Score 7  8 10       03/04/2023   11:20 AM 12/24/2022   11:41 AM 08/31/2019    8:30 AM  GAD 7 : Generalized Anxiety Score  Nervous, Anxious, on Edge 2 3 3   Control/stop worrying 3 3 3   Worry too much - different things 3 3 3   Trouble relaxing 2 2 2   Restless 1 0 0  Easily annoyed or irritable 1 0 1  Afraid - awful might happen 3 2 3   Total GAD 7 Score 15 13 15

## 2023-07-30 ENCOUNTER — Ambulatory Visit: Payer: Medicaid Other | Admitting: Clinical

## 2023-07-30 DIAGNOSIS — F411 Generalized anxiety disorder: Secondary | ICD-10-CM | POA: Diagnosis not present

## 2023-07-30 DIAGNOSIS — Z658 Other specified problems related to psychosocial circumstances: Secondary | ICD-10-CM

## 2023-08-01 ENCOUNTER — Encounter: Payer: Self-pay | Admitting: Family Medicine

## 2023-08-04 MED ORDER — SERTRALINE HCL 100 MG PO TABS: 100.0000 mg | ORAL_TABLET | Freq: Every day | ORAL | 3 refills | Status: DC

## 2023-08-05 NOTE — BH Specialist Note (Deleted)
Integrated Behavioral Health via Telemedicine Visit  08/05/2023 Christina [name] [id number]  Number of Integrated Behavioral Health Clinician visits: Additional Visit  Session Start time: 1519   Session End time: 1610  Total time in minutes: 51   Referring Provider: Wynelle Bourgeois, [name] Patient/Family location: Home*** [facility] Provider location: [facility]  All persons participating in visit: [name] and [facility] [name] ***  Types of Service: {CHL AMB TYPE OF SERVICE:[phone]}  I connected with [name] and/or Carmisha Luo's {family members:20773} via  Telephone or Video Enabled Telemedicine Application  (Video is Caregility application) and verified that I am speaking with the correct person using two identifiers. Discussed confidentiality: Yes   I discussed the limitations of telemedicine and the availability of in person appointments.  Discussed there is a possibility of technology failure and discussed alternative modes of communication if that failure occurs.  I discussed that engaging in this telemedicine visit, they consent to the provision of behavioral healthcare and the services will be billed under their insurance.  Patient and/or legal guardian expressed understanding and consented to Telemedicine visit: Yes   Presenting Concerns: Patient and/or family reports the following symptoms/concerns: *** Duration of problem: ***; Severity of problem: {Mild/Moderate/Severe:20260}  Patient and/or Family's Strengths/Protective Factors: {CHL AMB BH PROTECTIVE [phone]}  Goals Addressed: Patient will:  Reduce symptoms of: {IBH Symptoms:21014056}   Increase knowledge and/or ability of: {IBH Patient Tools:21014057}   Demonstrate ability to: {IBH Goals:21014053}  Progress towards Goals: {CHL AMB BH PROGRESS TOWARDS [phone]}  Interventions: Interventions utilized:  {IBH  Interventions:21014054} Standardized Assessments completed: {IBH Screening Tools:21014051}  Patient and/or Family Response: Patient agrees with treatment plan. ***  Assessment: Patient currently experiencing ***.   Patient may benefit from continued therapeutic intervention *** .  Plan: Follow up with behavioral health clinician on : *** Behavioral recommendations:  -*** -*** Referral(s): {IBH Referrals:21014055}  I discussed the assessment and treatment plan with the patient and/or parent/guardian. They were provided an opportunity to ask questions and all were answered. They agreed with the plan and demonstrated an understanding of the instructions.   They were advised to call back or seek an in-person evaluation if the symptoms worsen or if the condition fails to improve as anticipated.  [name] [name], LCSW     03/04/2023   11:19 AM 02/13/2023   10:00 AM 12/24/2022   11:40 AM 08/31/2019    8:30 AM  Depression screen PHQ 2/9  Decreased Interest 1 0 3 2  Down, Depressed, Hopeless 0 0 1 3  PHQ - 2 Score 1 0 4 5  Altered sleeping 2  0 2  Tired, decreased energy 3  3 2   Change in appetite 0  0 0  Feeling bad or failure about yourself  0  0 1  Trouble concentrating 1  1 0  Moving slowly or fidgety/restless 0  0 0  Suicidal thoughts 0  0 0  PHQ-9 Score 7  8 10       03/04/2023   11:20 AM 12/24/2022   11:41 AM 08/31/2019    8:30 AM  GAD 7 : Generalized Anxiety Score  Nervous, Anxious, on Edge 2 3 3   Control/stop worrying 3 3 3   Worry too much - different things 3 3 3   Trouble relaxing 2 2 2   Restless 1 0 0  Easily annoyed or irritable 1 0 1  Afraid - awful might happen 3 2 3   Total GAD 7 Score 15  13 15     

## 2023-08-20 NOTE — BH Specialist Note (Signed)
Integrated Behavioral Health via Telemedicine Visit  09/03/2023 Christina [name] [id number]  Number of Integrated Behavioral Health Clinician visits: Additional Visit  Session Start time: 9562   Session End time: 0918  Total time in minutes: 31   Referring Provider: Wynelle Bourgeois, [name] Patient/Family location: Home [facility] Provider location: [facility]  All persons participating in visit: [name] and [facility] Christina [name]   Types of Service: Individual psychotherapy and Video visit  I connected with [name] and/or [name]  n/a  via  Telephone or Video Enabled Telemedicine Application  (Video is Caregility application) and verified that I am speaking with the correct person using two identifiers. Discussed confidentiality: Yes   I discussed the limitations of telemedicine and the availability of in person appointments.  Discussed there is a possibility of technology failure and discussed alternative modes of communication if that failure occurs.  I discussed that engaging in this telemedicine visit, they consent to the provision of behavioral healthcare and the services will be billed under their insurance.  Patient and/or legal guardian expressed understanding and consented to Telemedicine visit: Yes   Presenting Concerns: Patient and/or family reports the following symptoms/concerns: Frustration and overwhelmed by daily routine; worry that poor appetite may impact milk production. Pt expresses she needs a vacation/change of scenery from the monotony of daily routine.  Duration of problem: Ongoing; Severity of problem: moderate  Patient and/or Family's Strengths/Protective Factors: Social connections, Concrete supports in place (healthy food, safe environments, etc.), and Sense of purpose  Goals Addressed: Patient will:  Reduce symptoms of: anxiety and stress    Demonstrate ability to: Increase  healthy adjustment to current life circumstances and Increase motivation to adhere to plan of care  Progress towards Goals: Ongoing  Interventions: Interventions utilized:  Solution-Focused Strategies Standardized Assessments completed: Not Needed  Patient and/or Family Response: Patient agrees with treatment plan.   Assessment: Patient currently experiencing Generalized anxiety disorder; Psychosocial tress.   Patient may benefit from continued therapeutic intervention  .  Plan: Follow up with behavioral health clinician on : Call Eder Macek at [phone], as needed. Behavioral recommendations:  -Continue taking Zoloft as prescribed -Continue plan to discontinue potty training temporarily -Continue kindness to self; naming emotions rather than pushing them away -Begin planning beach or mountain trip of choice. Consider allowing time for daydreaming and imagination beyond 2024 (ex. [place] etc[place] as discussed. Referral(s): Integrated [employer] (In Clinic)  I discussed the assessment and treatment plan with the patient and/or parent/guardian. They were provided an opportunity to ask questions and all were answered. They agreed with the plan and demonstrated an understanding of the instructions.   They were advised to call back or seek an in-person evaluation if the symptoms worsen or if the condition fails to improve as anticipated.  Christina [name] [name], LCSW     03/04/2023   11:19 AM 02/13/2023   10:00 AM 12/24/2022   11:40 AM 08/31/2019    8:30 AM  Depression screen PHQ 2/9  Decreased Interest 1 0 3 2  Down, Depressed, Hopeless 0 0 1 3  PHQ - 2 Score 1 0 4 5  Altered sleeping 2  0 2  Tired, decreased energy 3  3 2   Change in appetite 0  0 0  Feeling bad or failure about yourself  0  0 1  Trouble concentrating 1  1 0  Moving slowly or fidgety/restless 0  0 0  Suicidal thoughts 0  0 0  PHQ-9 Score 7  8 10       03/04/2023   11:20 AM 12/24/2022   11:41 AM  08/31/2019    8:30 AM  GAD 7 : Generalized Anxiety Score  Nervous, Anxious, on Edge 2 3 3   Control/stop worrying 3 3 3   Worry too much - different things 3 3 3   Trouble relaxing 2 2 2   Restless 1 0 0  Easily annoyed or irritable 1 0 1  Afraid - awful might happen 3 2 3   Total GAD 7 Score 15 13 15

## 2023-09-03 ENCOUNTER — Ambulatory Visit (INDEPENDENT_AMBULATORY_CARE_PROVIDER_SITE_OTHER): Payer: Medicaid Other | Admitting: Clinical

## 2023-09-03 DIAGNOSIS — Z658 Other specified problems related to psychosocial circumstances: Secondary | ICD-10-CM

## 2023-09-03 DIAGNOSIS — F411 Generalized anxiety disorder: Secondary | ICD-10-CM | POA: Diagnosis not present

## 2023-09-04 NOTE — BH Specialist Note (Signed)
Integrated Behavioral Health via Telemedicine Visit  09/17/2023 Christina [name] [id number]  Number of Integrated Behavioral Health Clinician visits: Additional Visit  Session Start time: 0945   Session End time: 1105  Total time in minutes: 80   Referring Provider: Wynelle Bourgeois, [name] Patient/Family location: Home [facility] Provider location: [facility]  All persons participating in visit: [name] and [facility] [name]   Types of Service: Individual psychotherapy and Video visit  I connected with [name] and/or [name]  n/a  via  Telephone or Video Enabled Telemedicine Application  (Video is Caregility application) and verified that I am speaking with the correct person using two identifiers. Discussed confidentiality: Yes   I discussed the limitations of telemedicine and the availability of in person appointments.  Discussed there is a possibility of technology failure and discussed alternative modes of communication if that failure occurs.  I discussed that engaging in this telemedicine visit, they consent to the provision of behavioral healthcare and the services will be billed under their insurance.  Patient and/or legal guardian expressed understanding and consented to Telemedicine visit: Yes   Presenting Concerns: Patient and/or family reports the following symptoms/concerns: n/a Duration of problem: Ongoing; Severity of problem: moderate  Patient and/or Family's Strengths/Protective Factors: Social connections, Concrete supports in place (healthy food, safe environments, etc.), Sense of purpose, and Physical Health (exercise, healthy diet, medication compliance, etc.)  Goals Addressed: Patient will:  Reduce symptoms of: anxiety    Demonstrate ability to: Increase motivation to adhere to plan of care  Progress towards Goals: Ongoing  Interventions: Interventions utilized:   Communication Skills and Supportive Reflection Standardized Assessments completed: Not Needed  Patient and/or Family Response: Patient agrees with treatment plan.   Assessment: Patient currently experiencing Generalized anxiety disorder.   Patient may benefit from continued therapeutic intervention.  Plan: Follow up with behavioral health clinician on : Two weeks Behavioral recommendations:  -Continue taking Zoloft as prescribed -Continue plan (non-mountain) vacation of choice -Continue kindness to self and recognizing/naming emotions as they come to awareness; continue working on making new mom friends -Consider "assuming the best of intentions" in the future with others Referral(s): Integrated [employer] (In Clinic)  I discussed the assessment and treatment plan with the patient and/or parent/guardian. They were provided an opportunity to ask questions and all were answered. They agreed with the plan and demonstrated an understanding of the instructions.   They were advised to call back or seek an in-person evaluation if the symptoms worsen or if the condition fails to improve as anticipated.  [name] [name], LCSW     03/04/2023   11:19 AM 02/13/2023   10:00 AM 12/24/2022   11:40 AM 08/31/2019    8:30 AM  Depression screen PHQ 2/9  Decreased Interest 1 0 3 2  Down, Depressed, Hopeless 0 0 1 3  PHQ - 2 Score 1 0 4 5  Altered sleeping 2  0 2  Tired, decreased energy 3  3 2   Change in appetite 0  0 0  Feeling bad or failure about yourself  0  0 1  Trouble concentrating 1  1 0  Moving slowly or fidgety/restless 0  0 0  Suicidal thoughts 0  0 0  PHQ-9 Score 7  8 10       03/04/2023   11:20 AM 12/24/2022   11:41 AM 08/31/2019    8:30 AM  GAD 7 : Generalized Anxiety Score  Nervous,  Anxious, on Edge 2 3 3   Control/stop worrying 3 3 3   Worry too much - different things 3 3 3   Trouble relaxing 2 2 2   Restless 1 0 0  Easily annoyed or irritable 1 0 1  Afraid -  awful might happen 3 2 3   Total GAD 7 Score 15 13 15

## 2023-09-09 ENCOUNTER — Ambulatory Visit: Payer: Medicaid Other

## 2023-09-17 ENCOUNTER — Ambulatory Visit (INDEPENDENT_AMBULATORY_CARE_PROVIDER_SITE_OTHER): Payer: Medicaid Other | Admitting: Clinical

## 2023-09-17 DIAGNOSIS — F411 Generalized anxiety disorder: Secondary | ICD-10-CM

## 2023-09-17 DIAGNOSIS — Z658 Other specified problems related to psychosocial circumstances: Secondary | ICD-10-CM
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
## 2023-10-07 ENCOUNTER — Ambulatory Visit (INDEPENDENT_AMBULATORY_CARE_PROVIDER_SITE_OTHER): Payer: Medicaid Other | Admitting: Clinical

## 2023-10-07 DIAGNOSIS — Z658 Other specified problems related to psychosocial circumstances: Secondary | ICD-10-CM

## 2023-10-07 DIAGNOSIS — F411 Generalized anxiety disorder: Secondary | ICD-10-CM

## 2023-10-07 NOTE — Patient Instructions (Signed)
[facility] [address] [place], [place] [address] [phone] (main office) [phone] (Breleigh Carpino's office)  Housing Resources                    [facility] (serves [place], [place], [place], [place], [place], [place], [place], [place], [place], [place], [place], [place], and [place] counties) [address], [place], [place] [address] [phone] www.ptrc.org  **Rental assistance, Home Rehabilitation,Weatherization Assistance Program, Heating Appliance Repair and Replacement Program, Housing Voucher Program  [facility] and Community Studies: Emergency Rental Assistance Resources to residents of [place], [place], and [place] Make sure you have your documents ready, including:  (Household income verification: 2 months pay stubs, unemployment/social security award letter, statement of no income for all household members over 18) Photo ID for all household members over 18 Utility Bill/Rent Ledger/Lease: must show past due amount for utilities/rent, or the rental agreement if rent is current 2. Start your application online or by paper (in English or Spanish) at:     [email or web address]  3. Once you have completed the online application, you will get an email confirmation message from the county. Expect to hear back by phone or email at least 6-10 weeks from submitting your application.  4. While you wait:  Call [phone] to check in on your application Let your landlord know that you've applied. Your landlord will be asked to submit documents (W-9) during this application process. Payments will be made directly to the landlord/property management company and utility company Rent or utility assistance for [place], and [place] Apply at [email or web address] Questions? Call or email Renee at  [phone] or [email or web address]   Eviction Mediation Program: The HOPE Program Https://www.rebuild.[place].gov/hope-program HOPE Progam serves low-income renters in 88 [place] counties, defined as less than or equal to 80% of the area median income for the county where the renter lives. In the following 12 counties, you should apply to your local rent and utility assistance program INSTEAD OF the HOPE Program: [place], [place], [place], [place], [place], [place], [place], [place], [place], [place], [place], [place]  If you live outside of [place], contact HOPE call center at [phone] to talk to a Program Representative Monday-Friday, 8am-5pm Note that Native American tribes also received federal funding for rent and utility assistance programs. Recognized members of the following tribes will be served by programs managed by tribal governments, including: Eastern Band of [place] Indians, [place] Tribe, [place] Indian Tribe, [place] Tribe of  and [place] Tribe Eviction Mediation Coordinator, Renee, [phone] [email or web address] Housing Resources Navigator, Stefanie, [phone] [email or web address]   Housing Resources [place]  [facility] [place] [address], [place], [place] [address] [phone]) [phone] www.gha-[place].org   [place] [facility] [address], [place], [place] [address] [phone]) [phone] www.gsohc.org **Programs include: Foreclosure Prevention and Housing Counseling, Healthy Homes/Tenant Advocacy, Homeless Prevention and Housing Assistance  [facility] [address], [place], [place] [address] [phone]) [phone] www.[place].gov **housing applications/recertification; tax payment relief/exemption under specific qualifications  [facility] [address] [place] [address], [place], [place] [address] www.onlineg[place].com/~maryshouse **transitional housing for  women in recovery who have minor children or are pregnant  [facility] [address], [place], [place] [address] www.ywcagsonc.org  **emergency shelter and support services for families facing homelessness  [facility] [address], ,  [address] [phone]) [phone] www.youthfocus.org **transitional housing to pregnant women; emergency housing for youth who have run away, are experiencing a family   crisis, are victims of abuse or neglect, or are homeless  [facility] [address], [place], [place] [address] [phone] ircgso.org **Drop-in center for people experiencing homelessness; overnight warming center when temperature is 25 degrees or below  [facility] [employer] [address] [place], [place], [place] [address] [phone] https://reentrystaffingagency.org/ **help with affordable housing to people experiencing homelessness or unemployment due to incarceration  [facility] [address] [place], [place] [address] [phone] www.greensborourbanministry.org  **emergency and transitional housing, rent/mortgage assistance, utility assistance  [facility] [address] [place], [place] [address] [phone] www.salvationarmyofgreensboro.org **emergency and transitional housing  [employer] for [employer] [address], [place], [place] [address] [phone] Www.habitatgreensboro.[facility]   [employer] [address], [place], [place] [address] [phone] https://chshousing.org **Home Ownership/Affordable Housing Program and [facility]  [facility] [address], [place], [place] [address] [phone] PaidValue.com.cy **home buyer education courses, foreclosure prevention  [facility] [address] [place], [place] [address] [phone] DefMagazine.is **Environmental Exposure Assessment (investigation of homes where either children or pregnant women with a confirmed elevated blood lead level reside)  [facility] [address] Nat Math [place], [place] [address] [phone] ShowReturn.ca **Home Expense Assistance/Repairs Program; offers home accessibility updates, such as ramps or bars in the bathroom  [facility] [address] [place], [place] [address] [phone] https://www.self-help.org/locations/[place]-branch **Offers credit-building and banking services to people unable to use traditional banking   Housing Resources [facility]  [facility] Authority- [place] [facility] [address] [place]inda Quest [place], [place] [address] [phone] ChatRepair.pl   [facility] [address], [place], [place] [address] [phone]  WrestlingMonthly.pl **housing applications/recertification, emergency and transitional housing  [employer] [address] [place], [place] [address] [phone] www.odm-hp.org  **emergency and permanent housing; rent/mortgage payment assistance  [employer] for [employer], [place] and [place] [address], [place], [place] [address] [phone] https://russell-walls.com/  Family Services of the [place], [employer] [address] [place], [place], [place] [address] www.familyservice-[place]mont.org **emergency shelter for victims of domestic violence and sexual assault  [facility] [address] [place], [place] [address] [phone] www.senior-resources-[place].org **Home expense assistance/repairs for older adults

## 2023-10-17 ENCOUNTER — Telehealth: Payer: Medicaid Other

## 2023-10-17 ENCOUNTER — Encounter: Payer: Self-pay | Admitting: Family Medicine

## 2023-10-21 NOTE — BH Specialist Note (Signed)
Integrated Behavioral Health via Telemedicine Visit  10/31/2023 Christina [name] [id number]  Number of Integrated Behavioral Health Clinician visits: Additional Visit  Session Start time: 1019   Session End time: 1116  Total time in minutes: 57   Referring Provider: Wynelle Bourgeois, [name] Patient/Family location: Home [facility] Provider location: [facility]  All persons participating in visit: [name] and [name] [name]   Types of Service: Individual psychotherapy and Video visit  I connected with [name] and/or [name]  n/a  via  Telephone or Video Enabled Telemedicine Application  (Video is Caregility application) and verified that I am speaking with the correct person using two identifiers. Discussed confidentiality: Yes   I discussed the limitations of telemedicine and the availability of in person appointments.  Discussed there is a possibility of technology failure and discussed alternative modes of communication if that failure occurs.  I discussed that engaging in this telemedicine visit, they consent to the provision of behavioral healthcare and the services will be billed under their insurance.  Patient and/or legal guardian expressed understanding and consented to Telemedicine visit: Yes   Presenting Concerns: Patient and/or family reports the following symptoms/concerns: Having to move back home with mom, due to eviction, by no later than next Tuesday; processing feelings about having to move back home again, financial instability, distrust of childcare; relationship issues.  Duration of problem: Current increased stress; Severity of problem: moderate  Patient and/or Family's Strengths/Protective Factors: Social connections, Concrete supports in place (healthy food, safe environments, etc.), Sense of purpose, and Physical Health (exercise, healthy diet, medication compliance,  etc.)  Goals Addressed: Patient will:  Reduce symptoms of: anxiety and stress   Increase knowledge and/or ability of: stress reduction   Demonstrate ability to: Increase healthy adjustment to current life circumstances  Progress towards Goals: Ongoing  Interventions: Interventions utilized:  Motivational Interviewing and Supportive Reflection Standardized Assessments completed: Not Needed  Patient and/or Family Response: Patient agrees with treatment plan.   Assessment: Patient currently experiencing Generalized anxiety disorder; Psychosocial stress.   Patient may benefit from continued therapeutic intervention  .  Plan: Follow up with behavioral health clinician on : One month Behavioral recommendations:  -Prioritize packing and preparing for move daily for today through Monday; final move on Tuesday -Continue to be mindful of keeping routine (sleeping and eating, etc.) for healthy self-care for self and children during this transition time; continue to set healthy boundaries with extended family over upcoming holiday time Referral(s): Integrated [employer] (In Clinic)  I discussed the assessment and treatment plan with the patient and/or parent/guardian. They were provided an opportunity to ask questions and all were answered. They agreed with the plan and demonstrated an understanding of the instructions.   They were advised to call back or seek an in-person evaluation if the symptoms worsen or if the condition fails to improve as anticipated.  [name], LCSW     03/04/2023   11:19 AM 02/13/2023   10:00 AM 12/24/2022   11:40 AM 08/31/2019    8:30 AM  Depression screen PHQ 2/9  Decreased Interest 1 0 3 2  Down, Depressed, Hopeless 0 0 1 3  PHQ - 2 Score 1 0 4 5  Altered sleeping 2  0 2  Tired, decreased energy 3  3 2   Change in appetite 0  0 0  Feeling bad or failure about yourself  0  0 1  Trouble concentrating 1  1 0  Moving slowly or  fidgety/restless 0  0 0  Suicidal thoughts 0  0 0  PHQ-9 Score 7  8 10       03/04/2023   11:20 AM 12/24/2022   11:41 AM 08/31/2019    8:30 AM  GAD 7 : Generalized Anxiety Score  Nervous, Anxious, on Edge 2 3 3   Control/stop worrying 3 3 3   Worry too much - different things 3 3 3   Trouble relaxing 2 2 2   Restless 1 0 0  Easily annoyed or irritable 1 0 1  Afraid - awful might happen 3 2 3   Total GAD 7 Score 15 13 15

## 2023-10-30 ENCOUNTER — Ambulatory Visit: Payer: Medicaid Other | Attending: Family Medicine | Admitting: Physical Therapy

## 2023-10-30 ENCOUNTER — Telehealth: Payer: Self-pay | Admitting: Physical Therapy

## 2023-10-30 NOTE — Telephone Encounter (Signed)
Called pt and left a VM d/t no show for her PT initial eval today.

## 2023-10-31 ENCOUNTER — Ambulatory Visit (INDEPENDENT_AMBULATORY_CARE_PROVIDER_SITE_OTHER): Payer: Medicaid Other | Admitting: Clinical

## 2023-10-31 DIAGNOSIS — F411 Generalized anxiety disorder: Secondary | ICD-10-CM

## 2023-10-31 DIAGNOSIS — Z658 Other specified problems related to psychosocial circumstances: Secondary | ICD-10-CM

## 2023-11-06 ENCOUNTER — Encounter: Payer: Medicaid Other | Admitting: Physical Therapy

## 2023-11-19 NOTE — BH Specialist Note (Signed)
Integrated Behavioral Health via Telemedicine Visit  12/03/2023 Christina [name] [id number]  Number of Integrated Behavioral Health Clinician visits: 2- Second Visit  Session Start time: 1317   Session End time: 1414  Total time in minutes: 57   Referring Provider: Wynelle Bourgeois, [name] Patient/Family location: Home [facility] Provider location: [name] [facility]  All persons participating in visit: [name] and Christina [name] Christina [name]   Types of Service: Individual psychotherapy and Video visit  I connected with Christina [name] and/or Christina [name]  n/a  via  Telephone or Video Enabled Telemedicine Application  (Video is Caregility application) and verified that I am speaking with the correct person using two identifiers. Discussed confidentiality: Yes   I discussed the limitations of telemedicine and the availability of in person appointments.  Discussed there is a possibility of technology failure and discussed alternative modes of communication if that failure occurs.  I discussed that engaging in this telemedicine visit, they consent to the provision of behavioral healthcare and the services will be billed under their insurance.  Patient and/or legal guardian expressed understanding and consented to Telemedicine visit: Yes   Presenting Concerns: Patient and/or family reports the following symptoms/concerns: Adjusting to temporary move back into mother's house with children, as well as processing difficult interactions with friends.  Duration of problem: About a month; Severity of problem: moderate  Patient and/or Family's Strengths/Protective Factors: Social connections, Concrete supports in place (healthy food, safe environments, etc.), Sense of purpose, and Physical Health (exercise, healthy diet, medication compliance, etc.)  Goals Addressed: Patient will:  Reduce symptoms of: anxiety and stress    Demonstrate  ability to: Increase healthy adjustment to current life circumstances  Progress towards Goals: Ongoing  Interventions: Interventions utilized:  Supportive Reflection Standardized Assessments completed: Not Needed  Patient and/or Family Response: [name] and [name] Christina [name]    Assessment: Patient currently experiencing Generalized anxiety disorder; Psychosocial stress.   Patient may benefit from continued therapeutic intervention  .  Plan: Follow up with behavioral health clinician on : Three weeks Behavioral recommendations:  -Continue daily outings with children/family daily, or as needed, while staying in mother's house -Continue updated daily routines, for less stressful adjustment in new environment -Continue plans for low-stress holiday activities through the end of 2024 Referral(s): Integrated [employer] (In Clinic)  I discussed the assessment and treatment plan with the patient and/or parent/guardian. They were provided an opportunity to ask questions and all were answered. They agreed with the plan and demonstrated an understanding of the instructions.   They were advised to call back or seek an in-person evaluation if the symptoms worsen or if the condition fails to improve as anticipated.  [name] [name], LCSW     03/04/2023   11:19 AM 02/13/2023   10:00 AM 12/24/2022   11:40 AM 08/31/2019    8:30 AM  Depression screen PHQ 2/9  Decreased Interest 1 0 3 2  Down, Depressed, Hopeless 0 0 1 3  PHQ - 2 Score 1 0 4 5  Altered sleeping 2  0 2  Tired, decreased energy 3  3 2   Change in appetite 0  0 0  Feeling bad or failure about yourself  0  0 1  Trouble concentrating 1  1 0  Moving slowly or fidgety/restless 0  0 0  Suicidal thoughts 0  0 0  PHQ-9 Score 7  8 10       03/04/2023   11:20  AM 12/24/2022   11:41 AM 08/31/2019    8:30 AM  GAD 7 : Generalized Anxiety Score  Nervous, Anxious, on Edge 2 3 3   Control/stop worrying 3 3 3    Worry too much - different things 3 3 3   Trouble relaxing 2 2 2   Restless 1 0 0  Easily annoyed or irritable 1 0 1  Afraid - awful might happen 3 2 3   Total GAD 7 Score 15 13 15

## 2023-11-20 ENCOUNTER — Encounter: Payer: Medicaid Other | Admitting: Physical Therapy

## 2023-11-27 ENCOUNTER — Encounter: Payer: Medicaid Other | Admitting: Physical Therapy

## 2023-12-03 ENCOUNTER — Ambulatory Visit: Payer: Medicaid Other | Admitting: Clinical

## 2023-12-03 DIAGNOSIS — F411 Generalized anxiety disorder: Secondary | ICD-10-CM

## 2023-12-03 DIAGNOSIS — Z658 Other specified problems related to psychosocial circumstances: Secondary | ICD-10-CM

## 2023-12-15 NOTE — BH Specialist Note (Signed)
 Integrated Behavioral Health via Telemedicine Visit  12/29/2023 Christina [name] [id number]  Number of Integrated Behavioral Health Clinician visits: 3- Third Visit  Session Start time: 1346   Session End time: 1432  Total time in minutes: 46   Referring Provider: Earnie [name], [name] Patient/Family location: Home [facility] Provider location: [facility]  [name] persons participating in visit: [name] and [facility] Christina [name]   Types of Service: Individual psychotherapy and Telephone visit  I connected with Christina [name] and/or Christina [name]  n/a  via  Telephone or Video Enabled Telemedicine Application  (Video is Caregility application) and verified that I am speaking with the correct person using two identifiers. Discussed confidentiality: Yes   I discussed the limitations of telemedicine and the availability of in person appointments.  Discussed there is a possibility of technology failure and discussed alternative modes of communication if that failure occurs.  I discussed that engaging in this telemedicine visit, they consent to the provision of behavioral healthcare and the services will be billed under their insurance.  Patient and/or legal guardian expressed understanding and consented to Telemedicine visit: Yes   Presenting Concerns: Patient and/or family reports the following symptoms/concerns: Processing mixed emotions regarding temporary stay in mother's home, toddler demands, holiday joy vs disappointment, relationship conflict(family, friend, partner); losing cousin unexpectedly 12/19/23.  Duration of problem: Ongoing; loss less than two weeks ago; Severity of problem: moderate  Patient and/or Family's Strengths/Protective Factors: Social connections, Concrete supports in place (healthy food, safe environments, etc.), Sense of purpose, and Physical Health (exercise, healthy diet, medication compliance,  etc.)  Goals Addressed: Patient will:  Reduce symptoms of: anxiety and stress    Demonstrate ability to: Increase healthy adjustment to current life circumstances  Progress towards Goals: Ongoing  Interventions: Interventions utilized:  Supportive Reflection Standardized Assessments completed: Not Needed  Patient and/or Family Response: Patient agrees with treatment plan.   Assessment: Patient currently experiencing Generalized anxiety disorder; Psychosocial stress; Grief.   Patient may benefit from continued therapeutic intervention  .  Plan: Follow up with behavioral health clinician on : Two weeks Behavioral recommendations:  -Continue taking Zoloft  as prescribed -Continue using daily self-coping strategies to manage during this transition time -Consider researching rental housing market options, as well as possible job options, to best prepare for upcoming move by end March/early April  Referral(s): Integrated [employer] (In Clinic)  I discussed the assessment and treatment plan with the patient and/or parent/guardian. They were provided an opportunity to ask questions and [name] were answered. They agreed with the plan and demonstrated an understanding of the instructions.   They were advised to call back or seek an in-person evaluation if the symptoms worsen or if the condition fails to improve as anticipated.  Warren BROCKS Naveya Ellerman, LCSW     03/04/2023   11:19 AM 02/13/2023   10:00 AM 12/24/2022   11:40 AM 08/31/2019    8:30 AM  Depression screen PHQ 2/9  Decreased Interest 1 0 3 2  Down, Depressed, Hopeless 0 0 1 3  PHQ - 2 Score 1 0 4 5  Altered sleeping 2  0 2  Tired, decreased energy 3  3 2   Change in appetite 0  0 0  Feeling bad or failure about yourself  0  0 1  Trouble concentrating 1  1 0  Moving slowly or fidgety/restless 0  0 0  Suicidal thoughts 0  0 0  PHQ-9 Score 7  8  10      03/04/2023   11:20 AM 12/24/2022   11:41 AM 08/31/2019    8:30  AM  GAD 7 : Generalized Anxiety Score  Nervous, Anxious, on Edge 2 3 3   Control/stop worrying 3 3 3   Worry too much - different things 3 3 3   Trouble relaxing 2 2 2   Restless 1 0 0  Easily annoyed or irritable 1 0 1  Afraid - awful might happen 3 2 3   Total GAD 7 Score 15 13 15

## 2023-12-29 ENCOUNTER — Ambulatory Visit (INDEPENDENT_AMBULATORY_CARE_PROVIDER_SITE_OTHER): Payer: Medicaid Other | Admitting: Clinical

## 2023-12-29 DIAGNOSIS — F411 Generalized anxiety disorder: Secondary | ICD-10-CM

## 2023-12-29 DIAGNOSIS — Z658 Other specified problems related to psychosocial circumstances: Secondary | ICD-10-CM

## 2023-12-29 DIAGNOSIS — F4321 Adjustment disorder with depressed mood: Secondary | ICD-10-CM
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
## 2024-01-12 ENCOUNTER — Ambulatory Visit (INDEPENDENT_AMBULATORY_CARE_PROVIDER_SITE_OTHER): Payer: Medicaid Other | Admitting: Clinical

## 2024-01-12 DIAGNOSIS — F411 Generalized anxiety disorder: Secondary | ICD-10-CM | POA: Diagnosis not present

## 2024-01-12 DIAGNOSIS — Z658 Other specified problems related to psychosocial circumstances: Secondary | ICD-10-CM

## 2024-01-12 NOTE — Patient Instructions (Signed)
[facility] [address] [place], [place] [address] [phone] (main office) [phone] (Swan Zayed's office)   [place] Child Counsellor  (Childcare options, Early childcare development, etc.) [email or web address]   [employer] Child Care Facility Search Engine  [email or web address]

## 2024-01-20 NOTE — BH Specialist Note (Signed)
Integrated Behavioral Health via Telemedicine Visit  01/28/2024 Christina [name] [id number]  Number of Integrated Behavioral Health Clinician visits: 4- Fourth Visit  Session Start time: 1600   Session End time: 1702  Total time in minutes: 62  Referring Provider: Wynelle Bourgeois, [name] Patient/Family location: Home [facility] Provider location: [facility]  All persons participating in visit: [name] and [facility] Christina [name]   Types of Service: Individual psychotherapy and Video visit  I connected with [name] and/or [name]  n/a  via  Telephone or Video Enabled Telemedicine Application  (Video is Caregility application) and verified that I am speaking with the correct person using two identifiers. Discussed confidentiality: Yes   I discussed the limitations of telemedicine and the availability of in person appointments.  Discussed there is a possibility of technology failure and discussed alternative modes of communication if that failure occurs.  I discussed that engaging in this telemedicine visit, they consent to the provision of behavioral healthcare and the services will be billed under their insurance.  Patient and/or legal guardian expressed understanding and consented to Telemedicine visit: Yes   Presenting Concerns: Patient and/or family reports the following symptoms/concerns: Dissatisfaction in current life circumstances, less patience living in mom's house while fiance lives in his parent's house; mixed feelings regarding relationship; uncertainty regarding whether or not to go back to school and/or if she will like upcoming new job; increased anxiety regarding standard health screening for job leading to many abnormal lab results without knowing what these results mean for her health. Pt is requesting increase in Zoloft.  Duration of problem: Ongoing; Severity of problem: moderate  Patient  and/or Family's Strengths/Protective Factors: Social connections, Concrete supports in place (healthy food, safe environments, etc.), Sense of purpose, and Physical Health (exercise, healthy diet, medication compliance, etc.)  Goals Addressed: Patient will:  Reduce symptoms of: anxiety and stress   Increase knowledge and/or ability of: stress reduction   Demonstrate ability to: Increase healthy adjustment to current life circumstances  Progress towards Goals: Ongoing  Interventions: Interventions utilized:  Supportive Reflection Standardized Assessments completed: Not Needed  Patient and/or Family Response: Patient agrees with treatment plan.   Assessment: Patient currently experiencing Generalized anxiety disorder; Psychosocial stress.   Patient may benefit from continued therapeutic intervention.  Plan: Follow up with behavioral health clinician on : Two weeks Behavioral recommendations:  -Continue taking Zoloft as prescribed; discuss any changes with medical provider -Continue daily self-coping strategies -Continue plan to begin training class for new job on 02/02/24 -Continue to consider couple counseling (-Read through information on After Visit Summary; use as needed) -Continue plan to attend ultrasound tomorrow; follow up with PCP on all results from recent lab work and ultrasound  Referral(s): Integrated [employer] (In Clinic)  I discussed the assessment and treatment plan with the patient and/or parent/guardian. They were provided an opportunity to ask questions and all were answered. They agreed with the plan and demonstrated an understanding of the instructions.   They were advised to call back or seek an in-person evaluation if the symptoms worsen or if the condition fails to improve as anticipated.  [name] Savana Spina, LCSW     03/04/2023   11:19 AM 02/13/2023   10:00 AM 12/24/2022   11:40 AM 08/31/2019    8:30 AM  Depression screen PHQ 2/9   Decreased Interest 1 0 3 2  Down, Depressed, Hopeless 0 0 1 3  PHQ - 2 Score 1  0 4 5  Altered sleeping 2  0 2  Tired, decreased energy 3  3 2   Change in appetite 0  0 0  Feeling bad or failure about yourself  0  0 1  Trouble concentrating 1  1 0  Moving slowly or fidgety/restless 0  0 0  Suicidal thoughts 0  0 0  PHQ-9 Score 7  8 10       03/04/2023   11:20 AM 12/24/2022   11:41 AM 08/31/2019    8:30 AM  GAD 7 : Generalized Anxiety Score  Nervous, Anxious, on Edge 2 3 3   Control/stop worrying 3 3 3   Worry too much - different things 3 3 3   Trouble relaxing 2 2 2   Restless 1 0 0  Easily annoyed or irritable 1 0 1  Afraid - awful might happen 3 2 3   Total GAD 7 Score 15 13 15

## 2024-01-28 ENCOUNTER — Ambulatory Visit (INDEPENDENT_AMBULATORY_CARE_PROVIDER_SITE_OTHER): Payer: Medicaid Other

## 2024-01-28 DIAGNOSIS — Z658 Other specified problems related to psychosocial circumstances: Secondary | ICD-10-CM

## 2024-01-28 DIAGNOSIS — F411 Generalized anxiety disorder: Secondary | ICD-10-CM

## 2024-01-28 NOTE — Patient Instructions (Signed)
[facility] at [facility] [address] [place], [place] [address] [phone] (main office) [phone] [facility] office)  Psychology Today [email or web address] (*use search to find therapists who specialize in couple's therapy)

## 2024-01-30 ENCOUNTER — Encounter: Payer: Self-pay | Admitting: Family Medicine

## 2024-01-30 MED ORDER — SERTRALINE HCL 100 MG PO TABS: 150.0000 mg | ORAL_TABLET | Freq: Every day | ORAL | 3 refills | Status: AC
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
## 2024-02-12 ENCOUNTER — Ambulatory Visit: Payer: Medicaid Other

## 2024-02-12 DIAGNOSIS — F411 Generalized anxiety disorder: Secondary | ICD-10-CM | POA: Diagnosis not present

## 2024-02-12 DIAGNOSIS — Z658 Other specified problems related to psychosocial circumstances: Secondary | ICD-10-CM

## 2024-02-19 NOTE — BH Specialist Note (Unsigned)
Integrated Behavioral Health via Telemedicine Visit  02/19/2024 [name] [id number]  Number of Integrated Behavioral Health Clinician visits: 6-Sixth Visit  Session Start time: 1548   Session End time: 1706  Total time in minutes: 78   Referring Provider: Wynelle Bourgeois, [name] Patient/Family location: Home*** [facility] Provider location: [facility]  All persons participating in visit: [name] and [facility] [name] ***  Types of Service: {CHL AMB TYPE OF [phone]}  I connected with Christina [name] and/or Christina [name]'s {family members:20773} via  Telephone or Video Enabled Telemedicine Application  (Video is Caregility application) and verified that I am speaking with the correct person using two identifiers. Discussed confidentiality: Yes   I discussed the limitations of telemedicine and the availability of in person appointments.  Discussed there is a possibility of technology failure and discussed alternative modes of communication if that failure occurs.  I discussed that engaging in this telemedicine visit, they consent to the provision of behavioral healthcare and the services will be billed under their insurance.  Patient and/or legal guardian expressed understanding and consented to Telemedicine visit: Yes   Presenting Concerns: Patient and/or family reports the following symptoms/concerns: *** Duration of problem: ***; Severity of problem: {Mild/Moderate/Severe:20260}  Patient and/or Family's Strengths/Protective Factors: {CHL AMB BH PROTECTIVE FACTORS:[phone]}  Goals Addressed: Patient will:  Reduce symptoms of: {IBH Symptoms:21014056}   Increase knowledge and/or ability of: {IBH Patient Tools:21014057}   Demonstrate ability to: {IBH Goals:21014053}  Progress towards Goals: {CHL AMB BH PROGRESS TOWARDS [phone]}  Interventions: Interventions utilized:  {IBH  Interventions:21014054} Standardized Assessments completed: {IBH Screening Tools:21014051}  Patient and/or Family Response: Patient agrees with treatment plan. ***  Assessment: Patient currently experiencing ***.   Patient may benefit from continued therapeutic intervention *** .  Plan: Follow up with behavioral health clinician on : *** Behavioral recommendations:  -*** -*** Referral(s): {IBH Referrals:21014055}  I discussed the assessment and treatment plan with the patient and/or parent/guardian. They were provided an opportunity to ask questions and all were answered. They agreed with the plan and demonstrated an understanding of the instructions.   They were advised to call back or seek an in-person evaluation if the symptoms worsen or if the condition fails to improve as anticipated.  [name], LCSW    03/04/2023   11:19 AM 02/13/2023   10:00 AM 12/24/2022   11:40 AM 08/31/2019    8:30 AM  Depression screen PHQ 2/9  Decreased Interest 1 0 3 2  Down, Depressed, Hopeless 0 0 1 3  PHQ - 2 Score 1 0 4 5  Altered sleeping 2  0 2  Tired, decreased energy 3  3 2   Change in appetite 0  0 0  Feeling bad or failure about yourself  0  0 1  Trouble concentrating 1  1 0  Moving slowly or fidgety/restless 0  0 0  Suicidal thoughts 0  0 0  PHQ-9 Score 7  8 10       03/04/2023   11:20 AM 12/24/2022   11:41 AM 08/31/2019    8:30 AM  GAD 7 : Generalized Anxiety Score  Nervous, Anxious, on Edge 2 3 3   Control/stop worrying 3 3 3   Worry too much - different things 3 3 3   Trouble relaxing 2 2 2   Restless 1 0 0  Easily annoyed or irritable 1 0 1  Afraid - awful might happen 3 2 3   Total GAD 7 Score 15 13  15     

## 2024-03-04 ENCOUNTER — Ambulatory Visit: Payer: Medicaid Other | Admitting: Clinical

## 2024-03-04 DIAGNOSIS — Z658 Other specified problems related to psychosocial circumstances: Secondary | ICD-10-CM

## 2024-03-04 DIAGNOSIS — F411 Generalized anxiety disorder: Secondary | ICD-10-CM

## 2024-03-04 NOTE — Patient Instructions (Signed)
 Center for [facility] at [facility] [address] [place], [place] [address] [phone] (main office) [phone] (Onnie Hatchel's office)  [employer] [email or web address]

## 2024-03-09 NOTE — BH Specialist Note (Unsigned)
 ADULT Comprehensive Clinical Assessment (CCA) Note   03/24/2024 Christina [name] [id number]   Referring Provider: Wynelle Bourgeois, [name] Session Start time: [phone]    Session End time: 1059  Total time in minutes: 67   SUBJECTIVE: Christina [name] is a 28 y.o.   female accompanied by  n/a  Christina [name] was seen in consultation at the request of Wynelle Bourgeois, [name] for evaluation of  anxiety .  Types of Service: Comprehensive Clinical Assessment (CCA) and Video visit  Reason for referral in patient/family's own words:  Anxiety and risk of postpartum depression    She likes to be called Christina [name].  She came to the appointment with  n/a .  Primary language at home is [place].  Constitutional Appearance: cooperative, well-nourished, well-developed, alert and well-appearing  (Patient to answer as appropriate) Gender identity: Female Sex assigned at birth: Female Pronouns: she   Mental status exam:   General Appearance /Behavior:  Casual Eye Contact:  Fair Motor Behavior:  Normal Speech:  Normal Level of Consciousness:  Alert Mood:  Anxious Affect:  Appropriate Anxiety Level:  Moderate Thought Process:  Coherent Thought Content:  WNL Perception:  Normal Judgment:  Good Insight:  Present   Current Medications and therapies: She is taking:   Outpatient Encounter Medications as of 03/23/2024  Medication Sig   Prenatal Vit-Fe Fumarate-FA (MULTIVITAMIN-PRENATAL) 27-0.8 MG TABS tablet Take 1 tablet by mouth daily at 12 noon.   Probiotic Product ([employer] MOOD SUPPORT PROBIOTIC) CAPS Take by mouth.   sertraline (ZOLOFT) 100 MG tablet Take 1.5 tablets (150 mg total) by mouth daily.   No facility-administered encounter medications on file as of 03/23/2024.     Therapies:  None  Family history: Family mental illness:   none known/diagnosis; suspects undiagnosedGrandmother learning disability  Other relevant family history:   None known  Social History: Now living with mother and  children; grandmother, brother, sister .Marland Kitchen Employment:   Employed part-time Health:  Good, has regular medical care; will ask PCP about weight loss options and retesting for autoimmune disorders Religious or Spiritual Beliefs: N/A  Mood: She  is often anxious . PHQ9/GAD7 given in the past  Negative Mood Concerns. 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 yes, not excessive  Have you recently used any drugs?  yes, birthday  Have you recently consumed any tobacco? no Does patient seem concerned about dependence or abuse of any substance? no  Substance Use Disorder Checklist:  N/A  Severity Risk Scoring based on DSM-5 Criteria for Substance Use Disorder. The presence of at least two (2) criteria in the last 12 months indicate a substance use disorder. The severity of the substance use disorder is defined as:  Mild: Presence of 2-3 criteria Moderate: Presence of 4-5 criteria Severe: Presence of 6 or more criteria  Traumatic Experiences: History or current traumatic events (natural disaster, house fire, etc.)? no History or current physical trauma?  yes, recent severe stubbing of toe History or current emotional trauma?  yes, family emotional trauma in childhood History or current sexual trauma?  no History or current domestic or intimate  partner violence?  no History of bullying:  yes, as a child  Risk Assessment: Suicidal or homicidal thoughts?   no Self injurious behaviors?  no Guns in the home?  yes, mother has a  gun  Self Harm Risk Factors: Family or marital conflict  Self Harm Thoughts?: No  Patient and/or Family's Strengths/Protective Factors: Social connections, Concrete supports in place (healthy food, safe environments, etc.), Sense of purpose, Physical Health (exercise, healthy diet, medication compliance, etc.), and Caregiver has knowledge of parenting & child development  Patient's and/or Family's Goals in their own words: To be able to manage my emotions and anxiety normally  Interventions: Interventions utilized:  Supportive Reflection and CCA    Patient and/or Family Response: Patient agrees with treatment plan.   Standardized Assessments completed:  Not given today  Patient Centered Plan: Patient is on the following Treatment Plan(s):  IBH  Coordination of Care:  Coordination with ob/gyn practice for integrated [facility] care as needed  DSM-5 Diagnosis: Generalized anxiety disorder  Recommendations for Services/Supports/Treatments: continued therapeutic intervention  via integrated behavioral health services   Progress towards Goals: Ongoing  Treatment Plan Summary: Behavioral Health Clinician will: Assess individual's status and evaluate for psychiatric symptoms, Provide coping skills enhancement, Utilize evidence based practices to address psychiatric symptoms, and Provide therapeutic counseling and medication monitoring  Individual will: Report all reactions/side effects, concerns about medications to prescribing doctor provider, Take all medications as prescribed, Report any thoughts or plans of harming themselves or others, and Utilize coping skills taught in therapy to reduce symptoms  Referral(s): Integrated [employer] (In Clinic)  [place], [place]

## 2024-03-23 ENCOUNTER — Ambulatory Visit: Admitting: Clinical

## 2024-03-23 DIAGNOSIS — F411 Generalized anxiety disorder: Secondary | ICD-10-CM | POA: Diagnosis not present

## 2024-03-23 DIAGNOSIS — Z658 Other specified problems related to psychosocial circumstances: Secondary | ICD-10-CM

## 2024-03-31 NOTE — BH Specialist Note (Signed)
 Integrated Behavioral Health via Telemedicine Visit  04/15/2024 Christina [name] [id number]  Number of Integrated Behavioral Health Clinician visits: Additional Visit  Session Start time: 1520   Session End time: 1605  Total time in minutes: 45   Referring Provider: Holmes Lusher, [name] Patient/Family location: Home [facility] Provider location: [facility]  All persons participating in visit: [name] and [facility] Omie Ferger   Types of Service: Individual psychotherapy and Video visit  I connected with Joylene Noe and/or Amiel Kalata  n/a  via  Telephone or Video Enabled Telemedicine Application  (Video is Caregility application) and verified that I am speaking with the correct person using two identifiers. Discussed confidentiality: Yes   I discussed the limitations of telemedicine and the availability of in person appointments.  Discussed there is a possibility of technology failure and discussed alternative modes of communication if that failure occurs.  I discussed that engaging in this telemedicine visit, they consent to the provision of behavioral healthcare and the services will be billed under their insurance.  Patient and/or legal guardian expressed understanding and consented to Telemedicine visit: Yes   Presenting Concerns: Patient and/or family reports the following symptoms/concerns: Mixed emotions regarding complicated friendships and relationship with children's father; upcoming goal is to move into her own housing by July 1.  Duration of problem: Ongoing; Severity of problem: moderate  Patient and/or Family's Strengths/Protective Factors: Social connections, Concrete supports in place (healthy food, safe environments, etc.), Sense of purpose, and Physical Health (exercise, healthy diet, medication compliance, etc.)  Goals Addressed: Patient will:  Reduce symptoms of: anxiety and stress    Increase knowledge and/or ability of: stress reduction   Demonstrate ability to: Increase healthy adjustment to current life circumstances  Progress towards Goals: Ongoing  Interventions: Interventions utilized:  Motivational Interviewing and Supportive Reflection Standardized Assessments completed: Not Needed  Patient and/or Family Response: Patient agrees with treatment plan.   Assessment: Patient currently experiencing Generalized anxiety disorder; Psychosocial stress.   Patient may benefit from continued therapeutic intervention. .  Plan: Follow up with behavioral health clinician on : One month Behavioral recommendations:  -Continue taking Zoloft  as prescribed; continue daily self-coping strategies and limiting news consumption to minimum -Begin looking for summer work now (while continuing current position) to save money towards moving out of [facility] by July 1st (two months from now) -Continue to consider spending more time with supportive people in life; less time with unsupportive people (Okay to be friends for a season in life only, rather than a lifetime) Referral(s): Integrated [employer] (In Clinic)  I discussed the assessment and treatment plan with the patient and/or parent/guardian. They were provided an opportunity to ask questions and all were answered. They agreed with the plan and demonstrated an understanding of the instructions.   They were advised to call back or seek an in-person evaluation if the symptoms worsen or if the condition fails to improve as anticipated.  [name], LCSW     03/04/2023   11:19 AM 02/13/2023   10:00 AM 12/24/2022   11:40 AM 08/31/2019    8:30 AM  Depression screen PHQ 2/9  Decreased Interest 1 0 3 2  Down, Depressed, Hopeless 0 0 1 3  PHQ - 2 Score 1 0 4 5  Altered sleeping 2  0 2  Tired, decreased energy 3  3 2   Change in appetite 0  0 0  Feeling bad or failure about yourself  0  0 1  Trouble  concentrating 1  1 0  Moving slowly or fidgety/restless 0  0 0  Suicidal thoughts 0  0 0  PHQ-9 Score 7  8 10       03/04/2023   11:20 AM 12/24/2022   11:41 AM 08/31/2019    8:30 AM  GAD 7 : Generalized Anxiety Score  Nervous, Anxious, on Edge 2 3 3   Control/stop worrying 3 3 3   Worry too much - different things 3 3 3   Trouble relaxing 2 2 2   Restless 1 0 0  Easily annoyed or irritable 1 0 1  Afraid - awful might happen 3 2 3   Total GAD 7 Score 15 13 15

## 2024-04-14 ENCOUNTER — Ambulatory Visit: Admitting: Clinical

## 2024-04-14 DIAGNOSIS — F411 Generalized anxiety disorder: Secondary | ICD-10-CM | POA: Diagnosis not present

## 2024-04-14 DIAGNOSIS — Z658 Other specified problems related to psychosocial circumstances: Secondary | ICD-10-CM

## 2024-04-14 NOTE — Patient Instructions (Signed)
 Center for [facility] at [facility] for Women [address] [place], [place] [address] [phone] (main office) [phone] (Konner Saiz's office)

## 2024-05-13 ENCOUNTER — Ambulatory Visit: Admitting: Clinical

## 2024-05-13 DIAGNOSIS — F411 Generalized anxiety disorder: Secondary | ICD-10-CM

## 2024-05-13 DIAGNOSIS — Z658 Other specified problems related to psychosocial circumstances: Secondary | ICD-10-CM

## 2024-05-13 NOTE — BH Specialist Note (Signed)
 Integrated Behavioral Health via Telemedicine Visit  05/13/2024 Christina [name] [id number]  Number of Integrated Behavioral Health Clinician visits: Additional Visit  Session Start time: 1520   Session End time: 1605  Total time in minutes: 45   Referring Provider: Holmes Lusher, [name] Patient/Family location: Home [facility] Provider location: [facility]  All persons participating in visit: [name] and [facility] Sofie Schendel   Types of Service: Individual psychotherapy and Video visit  I connected with Joylene Noe and/or Amiel Kalata n/a via  Telephone or Video Enabled Telemedicine Application  (Video is Caregility application) and verified that I am speaking with the correct person using two identifiers. Discussed confidentiality: Yes   I discussed the limitations of telemedicine and the availability of in person appointments.  Discussed there is a possibility of technology failure and discussed alternative modes of communication if that failure occurs.  I discussed that engaging in this telemedicine visit, they consent to the provision of behavioral healthcare and the services will be billed under their insurance.  Patient and/or legal guardian expressed understanding and consented to Telemedicine visit: Yes   Presenting Concerns: Patient and/or family reports the following symptoms/concerns: Anxious over lack of reliable childcare affecting ability to work, as well as feeling lonely and impatient being stuck at home without transportation until car repairs are made; uncertain move date. Pt is coping right now by looking forward to beach trip in mid-June with boyfriend and their children.  Duration of problem: Ongoing; Severity of problem: moderate  Patient and/or Family's Strengths/Protective Factors: Social connections, Concrete supports in place (healthy food, safe environments, etc.), Sense of purpose, and  Physical Health (exercise, healthy diet, medication compliance, etc.)  Goals Addressed: Patient will:  Reduce symptoms of: anxiety and stress   Increase knowledge and/or ability of: stress reduction   Demonstrate ability to: Increase healthy adjustment to current life circumstances and Increase motivation to adhere to plan of care  Progress towards Goals: Ongoing  Interventions: Interventions utilized:  Motivational Interviewing and Supportive Reflection Standardized Assessments completed: Not Needed  Patient and/or Family Response: Patient agrees with treatment plan.   Assessment: Patient currently experiencing Generalized anxiety disorder; Psychosocial stress.   Patient may benefit from continued therapeutic intervention. .  Plan: Follow up with behavioral health clinician on : One month Behavioral recommendations:  -Continue taking Zoloft  as prescribed; continue daily self-coping strategies -Consider "countdown" of activities daily until beach vacation; consider small reward for children daily with  Referral(s): Integrated [employer] (In Clinic)  I discussed the assessment and treatment plan with the patient and/or parent/guardian. They were provided an opportunity to ask questions and all were answered. They agreed with the plan and demonstrated an understanding of the instructions.   They were advised to call back or seek an in-person evaluation if the symptoms worsen or if the condition fails to improve as anticipated.  Elfredia Grippe Iram Astorino, LCSW     03/04/2023   11:19 AM 02/13/2023   10:00 AM 12/24/2022   11:40 AM 08/31/2019    8:30 AM  Depression screen PHQ 2/9  Decreased Interest 1 0 3 2  Down, Depressed, Hopeless 0 0 1 3  PHQ - 2 Score 1 0 4 5  Altered sleeping 2  0 2  Tired, decreased energy 3  3 2   Change in appetite 0  0 0  Feeling bad or failure about yourself  0  0 1  Trouble concentrating 1  1 0  Moving slowly or fidgety/restless 0  0 0   Suicidal thoughts 0  0 0  PHQ-9 Score 7  8 10       03/04/2023   11:20 AM 12/24/2022   11:41 AM 08/31/2019    8:30 AM  GAD 7 : Generalized Anxiety Score  Nervous, Anxious, on Edge 2 3 3   Control/stop worrying 3 3 3   Worry too much - different things 3 3 3   Trouble relaxing 2 2 2   Restless 1 0 0  Easily annoyed or irritable 1 0 1  Afraid - awful might happen 3 2 3   Total GAD 7 Score 15 13 15

## 2024-06-09 ENCOUNTER — Telehealth: Payer: Self-pay | Admitting: Clinical

## 2024-06-09 NOTE — Telephone Encounter (Signed)
 Due to an emergency, Christina [name] will need to cancel your upcoming appointment. At this time, it's unclear when she'll be able to reschedule. We'll keep you updated as soon as we have more information.

## 2024-06-10 ENCOUNTER — Encounter

## 2024-07-05 ENCOUNTER — Telehealth: Payer: Self-pay | Admitting: Clinical

## 2024-07-05 NOTE — Telephone Encounter (Signed)
 Attempt to reschedule; Left HIPPA-compliant message to call back Warren from [employer] for [employer] at [facility] for Women at  [phone] [facility] office).

## 2024-08-06 NOTE — BH Specialist Note (Signed)
 Integrated Behavioral Health via Telemedicine Visit  08/09/2024 Christina [name] [id number]  Number of Integrated Behavioral Health Clinician visits: 1- Initial Visit  Session Start time: 1426   Session End time: 1510  Total time in minutes: 44  Referring Provider: Earnie Pouch, [name] Patient/Family location: Home [facility] Provider location: [facility]  All persons participating in visit: [name] and [facility] Diarra Ceja   Types of Service: Individual psychotherapy and Video visit  I connected with Christina [name] and/or Christina [name] n/a via  Telephone or Video Enabled Telemedicine Application  (Video is Caregility application) and verified that I am speaking with the correct person using two identifiers. Discussed confidentiality: Yes   I discussed the limitations of telemedicine and the availability of in person appointments.  Discussed there is a possibility of technology failure and discussed alternative modes of communication if that failure occurs.  I discussed that engaging in this telemedicine visit, they consent to the provision of behavioral healthcare and the services will be billed under their insurance.  Patient and/or legal guardian expressed understanding and consented to Telemedicine visit: Yes   Presenting Concerns: Patient and/or family reports the following symptoms/concerns: Increasing frustration, irritability, depression and anxiousness, after forgetting to take Zoloft  due to stress and fatigue, including financial stress, lack of childcare and household atmosphere.  Duration of problem: Ongoing; Severity of problem: moderately severe  Patient and/or Family's Strengths/Protective Factors: Social connections, Sense of purpose, and Physical Health (exercise, healthy diet, medication compliance, etc.)  Goals Addressed: Patient will:  Reduce symptoms of: anxiety, depression, and stress    Increase knowledge and/or ability of: stress reduction   Demonstrate ability to: Increase healthy adjustment to current life circumstances and Increase motivation to adhere to plan of care  Progress towards Goals: Ongoing    Interventions: Interventions utilized:  Motivational Interviewing and Supportive Reflection Standardized Assessments completed: Not Needed  Patient and/or Family Response: Patient agrees with treatment plan.  Clinical Assessment/Diagnosis  No diagnosis found.   Patient may benefit from psychoeducation and brief therapeutic interventions regarding coping with symptoms of depression, anxiety, life stress .  Plan: Follow up with behavioral health clinician on : Two weeks Behavioral recommendations:  -Attend upcoming [employer] meeting this week -Continue taking Zoloft  as prescribed (begin taking before dinner same time; set timer on phone as reminder if needed) -Continue daily self-coping strategies; getting out of the house as much as able with children prior to school start date Referral(s): Integrated [employer] (In Clinic)  I discussed the assessment and treatment plan with the patient and/or parent/guardian. They were provided an opportunity to ask questions and all were answered. They agreed with the plan and demonstrated an understanding of the instructions.   They were advised to call back or seek an in-person evaluation if the symptoms worsen or if the condition fails to improve as anticipated.  Warren BROCKS Kamela Blansett, LCSW     03/04/2023   11:19 AM 02/13/2023   10:00 AM 12/24/2022   11:40 AM 08/31/2019    8:30 AM  Depression screen PHQ 2/9  Decreased Interest 1 0 3 2  Down, Depressed, Hopeless 0 0 1 3  PHQ - 2 Score 1 0 4 5  Altered sleeping 2  0 2  Tired, decreased energy 3  3 2   Change in appetite 0  0 0  Feeling bad or failure about yourself  0  0 1  Trouble concentrating 1  1 0  Moving slowly or fidgety/restless 0  0 0   Suicidal thoughts 0  0 0  PHQ-9 Score 7  8 10       03/04/2023   11:20 AM 12/24/2022   11:41 AM 08/31/2019    8:30 AM  GAD 7 : Generalized Anxiety Score  Nervous, Anxious, on Edge 2 3 3   Control/stop worrying 3 3 3   Worry too much - different things 3 3 3   Trouble relaxing 2 2 2   Restless 1 0 0  Easily annoyed or irritable 1 0 1  Afraid - awful might happen 3 2 3   Total GAD 7 Score 15 13 15

## 2024-08-09 ENCOUNTER — Ambulatory Visit: Admitting: Clinical

## 2024-08-09 DIAGNOSIS — F411 Generalized anxiety disorder: Secondary | ICD-10-CM | POA: Diagnosis not present

## 2024-08-09 DIAGNOSIS — Z658 Other specified problems related to psychosocial circumstances: Secondary | ICD-10-CM

## 2024-08-24 ENCOUNTER — Ambulatory Visit: Admitting: Clinical

## 2024-08-24 DIAGNOSIS — F411 Generalized anxiety disorder: Secondary | ICD-10-CM | POA: Diagnosis not present

## 2024-08-24 DIAGNOSIS — Z658 Other specified problems related to psychosocial circumstances: Secondary | ICD-10-CM

## 2024-08-24 NOTE — BH Specialist Note (Unsigned)
 Integrated Behavioral Health via Telemedicine Visit  08/24/2024 [name] [id number]  Number of Integrated Behavioral Health Clinician visits: 1- Initial Visit  Session Start time: 1426   Session End time: 1510  Total time in minutes: 44   Referring Provider: Earnie Pouch, [name] Patient/Family location: Home*** [facility] Provider location: [facility]  All persons participating in visit: [name] and [facility] Christina [name] ***  Types of Service: {CHL AMB TYPE OF [phone]}  I connected with [name] and/or Kamariya Dearden's {family members:20773} via  Telephone or Video Enabled Telemedicine Application  (Video is Caregility application) and verified that I am speaking with the correct person using two identifiers. Discussed confidentiality: Yes   I discussed the limitations of telemedicine and the availability of in person appointments.  Discussed there is a possibility of technology failure and discussed alternative modes of communication if that failure occurs.  I discussed that engaging in this telemedicine visit, they consent to the provision of behavioral healthcare and the services will be billed under their insurance.  Patient and/or legal guardian expressed understanding and consented to Telemedicine visit: Yes   Presenting Concerns: Patient and/or family reports the following symptoms/concerns: *** Duration of problem: ***; Severity of problem: {Mild/Moderate/Severe:20260}  Patient and/or Family's Strengths/Protective Factors: {CHL AMB BH PROTECTIVE [phone]}  Goals Addressed: Patient will:  Reduce symptoms of: {IBH Symptoms:21014056}   Increase knowledge and/or ability of: {IBH Patient Tools:21014057}   Demonstrate ability to: {IBH Goals:21014053}  Progress towards Goals: {CHL AMB BH PROGRESS TOWARDS GOALS:3156051916}    Interventions: Interventions utilized:  {IBH  Interventions:21014054} Standardized Assessments completed: {IBH Screening Tools:21014051}    Patient and/or Family Response: Patient agrees with treatment plan.   Clinical Assessment/Diagnosis  No diagnosis found.    Assessment: Patient currently experiencing ***.   Patient may benefit from continued therapeutic intervention *** .  Plan: Follow up with behavioral health clinician on : *** Behavioral recommendations:  -*** -*** Referral(s): {IBH Referrals:21014055}  I discussed the assessment and treatment plan with the patient and/or parent/guardian. They were provided an opportunity to ask questions and all were answered. They agreed with the plan and demonstrated an understanding of the instructions.   They were advised to call back or seek an in-person evaluation if the symptoms worsen or if the condition fails to improve as anticipated.  Susen Haskew C Carra Brindley, LCSW

## 2024-09-02 NOTE — BH Specialist Note (Signed)
 Integrated Behavioral Health via Telemedicine Visit  09/07/2024 Christina [name] [id number]  Number of Integrated Behavioral Health Clinician visits: 3- Third Visit  Session Start time: 0951   Session End time: 1024  Total time in minutes: 33  Referring Provider: Earnie [name], [name] Patient/Family location: Home [facility] Provider location: [facility]  All persons participating in visit: [name] and Christina [name]   Types of Service: Individual psychotherapy and Video visit  I connected with Christina [name] and/or Christina [name] n/a via  Telephone or Video Enabled Telemedicine Application  (Video is Caregility application) and verified that I am speaking with the correct person using two identifiers. Discussed confidentiality: Yes   I discussed the limitations of telemedicine and the availability of in person appointments.  Discussed there is a possibility of technology failure and discussed alternative modes of communication if that failure occurs.  I discussed that engaging in this telemedicine visit, they consent to the provision of behavioral healthcare and the services will be billed under their insurance.  Patient and/or legal guardian expressed understanding and consented to Telemedicine visit: Yes   Presenting Concerns: Patient and/or family reports the following symptoms/concerns: Recognizing that at times of extreme fatigue and/or after utilizing cannabis products, these are the times that she has noticed visual hallucinations; will continue to keep track of this to share with psychiatrist once established. Pt has also noticed that restless legs (and evening schedule picking up partner from work) have made sleep more difficult; using magnesium spray has helped.  Duration of problem: Uncertainty regarding start of visual disturbances; Severity of problem: moderately severe  Patient and/or Family's  Strengths/Protective Factors: Social connections, Concrete supports in place (healthy food, safe environments, etc.), and Sense of purpose  Goals Addressed: Patient will:  Reduce symptoms of: anxiety, insomnia, and stress   Increase knowledge and/or ability of: healthy habits   Demonstrate ability to: Increase motivation to adhere to plan of care  Progress towards Goals: Revised    Interventions: Interventions utilized:  Functional Assessment of ADLs and Supportive Reflection Standardized Assessments completed: Not Needed    Patient and/or Family Response: Patient agrees with treatment plan.   Clinical Assessment/Diagnosis  Generalized anxiety disorder - Plan: Ambulatory referral to Behavioral Health  Psychosocial stressors - Plan: Ambulatory referral to Behavioral Health  Visual hallucination - Plan: Ambulatory referral to Behavioral Health .   Patient may benefit from continued therapeutic intervention and referral to psychiatry for further evaluation regarding emerging visual hallucinations.   Plan: Follow up with behavioral health clinician on : Two weeks Behavioral recommendations:  -Continue keeping log of any visual hallucinations, sleep and substances consumed to share with psychiatry and/or PCP -Accept referral to psychiatry for further evaluation -Continue using self-coping strategies daily, with priority on stress management and healthy self-care, as discussed -Consider [facility] walk-in to establish care with psychiatry quicker than referral Referral(s): Integrated [employer] (In Clinic)  I discussed the assessment and treatment plan with the patient and/or parent/guardian. They were provided an opportunity to ask questions and all were answered. They agreed with the plan and demonstrated an understanding of the instructions.   They were advised to call back or seek an in-person evaluation if the symptoms worsen or if the condition fails to improve  as anticipated.  [name] C Senta Kantor, LCSW     03/04/2023   11:19 AM 02/13/2023   10:00 AM 12/24/2022   11:40 AM 08/31/2019    8:30 AM  Depression  screen PHQ 2/9  Decreased Interest 1 0 3 2  Down, Depressed, Hopeless 0 0 1 3  PHQ - 2 Score 1 0 4 5  Altered sleeping 2  0 2  Tired, decreased energy 3  3 2   Change in appetite 0  0 0  Feeling bad or failure about yourself  0  0 1  Trouble concentrating 1  1 0  Moving slowly or fidgety/restless 0  0 0  Suicidal thoughts 0  0 0  PHQ-9 Score 7  8 10       03/04/2023   11:20 AM 12/24/2022   11:41 AM 08/31/2019    8:30 AM  GAD 7 : Generalized Anxiety Score  Nervous, Anxious, on Edge 2 3 3   Control/stop worrying 3 3 3   Worry too much - different things 3 3 3   Trouble relaxing 2 2 2   Restless 1 0 0  Easily annoyed or irritable 1 0 1  Afraid - awful might happen 3 2 3   Total GAD 7 Score 15 13 15

## 2024-09-07 ENCOUNTER — Ambulatory Visit: Admitting: Clinical

## 2024-09-07 DIAGNOSIS — R441 Visual hallucinations: Secondary | ICD-10-CM

## 2024-09-07 DIAGNOSIS — Z658 Other specified problems related to psychosocial circumstances: Secondary | ICD-10-CM

## 2024-09-07 DIAGNOSIS — F411 Generalized anxiety disorder: Secondary | ICD-10-CM | POA: Diagnosis not present

## 2024-09-07 NOTE — Patient Instructions (Signed)

## 2024-09-22 ENCOUNTER — Ambulatory Visit: Admitting: Clinical

## 2024-09-22 DIAGNOSIS — F411 Generalized anxiety disorder: Secondary | ICD-10-CM

## 2024-09-22 DIAGNOSIS — Z658 Other specified problems related to psychosocial circumstances: Secondary | ICD-10-CM

## 2024-09-22 DIAGNOSIS — F1290 Cannabis use, unspecified, uncomplicated: Secondary | ICD-10-CM

## 2024-09-22 DIAGNOSIS — R441 Visual hallucinations: Secondary | ICD-10-CM

## 2024-09-22 NOTE — BH Specialist Note (Signed)
 Integrated Behavioral Health via Telemedicine Visit  09/22/2024 Christina [name] [id number]  Number of Integrated Behavioral Health Clinician visits: 4- Fourth Visit  Session Start time: [phone]   Session End time: 1036  Total time in minutes: 43  Referring Provider: Earnie Pouch, [name] Patient/Family location: Home [facility] Provider location: [facility]  All persons participating in visit: [name] and Christina [name]   Types of Service: Individual psychotherapy and Video visit  I connected with Christina [name] and/or Christina [name] n/a via  Telephone or Video Enabled Telemedicine Application  (Video is Caregility application) and verified that I am speaking with the correct person using two identifiers. Discussed confidentiality: Yes   I discussed the limitations of telemedicine and the availability of in person appointments.  Discussed there is a possibility of technology failure and discussed alternative modes of communication if that failure occurs.  I discussed that engaging in this telemedicine visit, they consent to the provision of behavioral healthcare and the services will be billed under their insurance.  Patient and/or legal guardian expressed understanding and consented to Telemedicine visit: Yes   Presenting Concerns: Patient and/or family reports the following symptoms/concerns: Stress over housing situation (will have to stay elsewhere for a full day due to work on the house); another visual hallucination (black blob or rat floating by on the floor) that lasted for seconds. Pt noticed a negative reaction to a THC seltzer and [place] (started freaking out and seeing a big cloud in the sky with a scary face) when others around her did not have the same reaction. Pt has been smoking twice daily, less than pre-children days; thinking about reducing usage, but concerned that she  becomes irritable when she stops altogether. Pt has a family history of psychosis (maternal grandmother).  Duration of problem: Ongoing; Severity of problem: moderately severe  Patient and/or Family's Strengths/Protective Factors: Social connections, Concrete supports in place (healthy food, safe environments, etc.), Sense of purpose, and Physical Health (exercise, healthy diet, medication compliance, etc.)  Goals Addressed: Patient will:  Reduce symptoms of: anxiety, insomnia, and stress   Increase knowledge and/or ability of: stress reduction   Demonstrate ability to: Increase healthy adjustment to current life circumstances, Increase motivation to adhere to plan of care, and Decrease self-medicating behaviors  Progress towards Goals: Ongoing    Interventions: Interventions utilized:  Motivational Interviewing and Supportive Reflection Standardized Assessments completed: Not Needed    Patient and/or Family Response: Patient agrees with treatment plan.   Clinical Assessment/Diagnosis  Generalized anxiety disorder  Psychosocial stressors  Visual hallucination  Cannabis use disorder   Patient may benefit from continued therapeutic intervention  .  Plan: Follow up with behavioral health clinician on : Two weeks Behavioral recommendations:  -Continue accepting referral to psychiatry for further assessment -Continue daily self-coping strategies as needed, prioritizing stress management and healthy self-care -Continue keeping log of visual hallucinations, sleep and substances consumed to discuss with psychiatrist -Consider very slow tapering of cannabis/THC products to minimize side effects (ex. lower by one less usage next week); keep log of any side effects Referral(s): Integrated [employer] (In Clinic)  I discussed the assessment and treatment plan with the patient and/or parent/guardian. They were provided an opportunity to ask questions and all were  answered. They agreed with the plan and demonstrated an understanding of the instructions.   They were advised to call back or seek an in-person evaluation if the symptoms worsen  or if the condition fails to improve as anticipated.  Warren JAYSON Mering, LCSW     03/04/2023   11:19 AM 02/13/2023   10:00 AM 12/24/2022   11:40 AM 08/31/2019    8:30 AM  Depression screen PHQ 2/9  Decreased Interest 1 0 3 2  Down, Depressed, Hopeless 0 0 1 3  PHQ - 2 Score 1 0 4 5  Altered sleeping 2  0 2  Tired, decreased energy 3  3 2   Change in appetite 0  0 0  Feeling bad or failure about yourself  0  0 1  Trouble concentrating 1  1 0  Moving slowly or fidgety/restless 0  0 0  Suicidal thoughts 0  0 0  PHQ-9 Score 7  8 10       03/04/2023   11:20 AM 12/24/2022   11:41 AM 08/31/2019    8:30 AM  GAD 7 : Generalized Anxiety Score  Nervous, Anxious, on Edge 2 3 3   Control/stop worrying 3 3 3   Worry too much - different things 3 3 3   Trouble relaxing 2 2 2   Restless 1 0 0  Easily annoyed or irritable 1 0 1  Afraid - awful might happen 3 2 3   Total GAD 7 Score 15 13 15

## 2024-09-30 NOTE — BH Specialist Note (Signed)
 Integrated Behavioral Health via Telemedicine Visit  10/07/2024 [name] [id number]  Number of Integrated Behavioral Health Clinician visits: 5-Fifth Visit  Session Start time: 1050   Session End time: 1142  Total time in minutes: 52  Referring Provider: Earnie Pouch, [name] Patient/Family location: Home  [facility] Provider location: [facility]  All persons participating in visit: [name] and [facility] Fermin Yan   Types of Service: Individual psychotherapy and Telephone visit  I connected with [name] and/or [name] n/a via  Telephone or Video Enabled Telemedicine Application  (Video is Caregility application) and verified that I am speaking with the correct person using two identifiers. Discussed confidentiality: Yes   I discussed the limitations of telemedicine and the availability of in person appointments.  Discussed there is a possibility of technology failure and discussed alternative modes of communication if that failure occurs.  I discussed that engaging in this telemedicine visit, they consent to the provision of behavioral healthcare and the services will be billed under their insurance.  Patient and/or legal guardian expressed understanding and consented to Telemedicine visit: Yes   Presenting Concerns: Patient and/or family reports the following symptoms/concerns: Two more incidents of visual hallucinations at home only; one incident while well-rested and no recent substance use; pt admits there is likely black mold in her mother's home, not well-ventilated and  windows are never opened. Pt has felt more motivation to take on work outside the home while children are in school, to help pay for upcoming holiday expenses, new tires for car; to meet goal of moving out of mom's home after tax return in early 2026.  Duration of problem: Ongoing; Severity of problem: moderately  severe  Patient and/or Family's Strengths/Protective Factors: Social connections, Concrete supports in place (healthy food, safe environments, etc.), Sense of purpose, and Physical Health (exercise, healthy diet, medication compliance, etc.)  Goals Addressed: Patient will:  Reduce symptoms of: anxiety, insomnia, and stress   Increase knowledge and/or ability of: healthy habits   Demonstrate ability to: Increase healthy adjustment to current life circumstances, Increase adequate support systems for patient/family, and Increase motivation to adhere to plan of care  Progress towards Goals: Ongoing  Interventions: Interventions utilized:  Motivational Interviewing and Supportive Reflection Standardized Assessments completed: Not Needed  Patient and/or Family Response: Patient agrees with treatment plan.  Clinical Assessment/Diagnosis  Generalized anxiety disorder  Cannabis use disorder  Visual hallucination  Psychosocial stressors   Patient may benefit from continued therapeutic intervention.  Plan: Follow up with behavioral health clinician on : Two weeks Behavioral recommendations:  -Continue to consider attending upcoming assessment appointment at [facility], prior to psychiatry -Continue keeping log of visual hallucinations and any possible trigger for them (poor sleep, cannabis/THC products, stress; mold in home); reduce exposures to above as able -Consider spending as little time as needed in current home; opening windows for ventilation; consider air purifier as able; working while children are in school to put money towards move -Establish care with PCP of choice Referral(s): Integrated [employer] (In Clinic) and [employer] [facility] (LME/Outside Clinic)  I discussed the assessment and treatment plan with the patient and/or parent/guardian. They were provided an opportunity to ask questions and all were answered. They agreed with the plan and  demonstrated an understanding of the instructions.   They were advised to call back or seek an in-person evaluation if the symptoms worsen or if the condition fails to improve as anticipated.  Warren BROCKS Carolan Avedisian, LCSW     03/04/2023   11:19 AM 02/13/2023   10:00 AM 12/24/2022   11:40 AM 08/31/2019    8:30 AM  Depression screen PHQ 2/9  Decreased Interest 1 0 3 2  Down, Depressed, Hopeless 0 0 1 3  PHQ - 2 Score 1 0 4 5  Altered sleeping 2  0 2  Tired, decreased energy 3  3 2   Change in appetite 0  0 0  Feeling bad or failure about yourself  0  0 1  Trouble concentrating 1  1 0  Moving slowly or fidgety/restless 0  0 0  Suicidal thoughts 0  0 0  PHQ-9 Score 7  8 10       03/04/2023   11:20 AM 12/24/2022   11:41 AM 08/31/2019    8:30 AM  GAD 7 : Generalized Anxiety Score  Nervous, Anxious, on Edge 2 3 3   Control/stop worrying 3 3 3   Worry too much - different things 3 3 3   Trouble relaxing 2 2 2   Restless 1 0 0  Easily annoyed or irritable 1 0 1  Afraid - awful might happen 3 2 3   Total GAD 7 Score 15 13 15

## 2024-10-07 ENCOUNTER — Ambulatory Visit (INDEPENDENT_AMBULATORY_CARE_PROVIDER_SITE_OTHER): Admitting: Clinical

## 2024-10-07 DIAGNOSIS — F411 Generalized anxiety disorder: Secondary | ICD-10-CM | POA: Diagnosis not present

## 2024-10-07 DIAGNOSIS — F1290 Cannabis use, unspecified, uncomplicated: Secondary | ICD-10-CM

## 2024-10-07 DIAGNOSIS — R441 Visual hallucinations: Secondary | ICD-10-CM

## 2024-10-07 DIAGNOSIS — Z658 Other specified problems related to psychosocial circumstances: Secondary | ICD-10-CM

## 2024-10-25 ENCOUNTER — Ambulatory Visit (INDEPENDENT_AMBULATORY_CARE_PROVIDER_SITE_OTHER): Admitting: Clinical

## 2024-10-25 DIAGNOSIS — F411 Generalized anxiety disorder: Secondary | ICD-10-CM | POA: Diagnosis not present

## 2024-10-25 DIAGNOSIS — Z658 Other specified problems related to psychosocial circumstances: Secondary | ICD-10-CM

## 2024-10-25 DIAGNOSIS — R441 Visual hallucinations: Secondary | ICD-10-CM

## 2024-10-25 DIAGNOSIS — F1290 Cannabis use, unspecified, uncomplicated: Secondary | ICD-10-CM

## 2024-10-25 NOTE — BH Specialist Note (Signed)
 Integrated Behavioral Health via Telemedicine Visit  10/25/2024 Christina [name] [id number]  Number of Integrated Behavioral Health Clinician visits: 6-Sixth Visit  Session Start time: 1037   Session End time: 1050  Total time in minutes: 13   Referring Provider: Earnie Pouch, [name] Patient/Family location: Work break/[place] [facility] Provider location: Center for [employer] at [facility]  All persons participating in visit: [name] and [facility] Mykale [name]   Types of Service: Individual psychotherapy and Telephone visit  I connected with [name] and/or [name] n/a via  Telephone or Video Enabled Telemedicine Application  (Video is Caregility application) and verified that I am speaking with the correct person using two identifiers. Discussed confidentiality: Yes   I discussed the limitations of telemedicine and the availability of in person appointments.  Discussed there is a possibility of technology failure and discussed alternative modes of communication if that failure occurs.  I discussed that engaging in this telemedicine visit, they consent to the provision of behavioral healthcare and the services will be billed under their insurance.  Patient and/or legal guardian expressed understanding and consented to Telemedicine visit: Yes   Presenting Concerns: Patient and/or family reports the following symptoms/concerns: Noticed on 10/08/24 (not under the influence of any cannabis product), saw large black object move across the kitchen floor, then lines; then what looked like thousands of bugs moving on the carpet and dots in the air. Pt remembered that once, in her early 20's, she saw dots when driving, told her eye doctor, who dismissed this as normal.  Duration of problem: Ongoing; Severity of problem: moderately severe  Patient and/or Family's Strengths/Protective Factors: Social connections, Concrete supports in  place (healthy food, safe environments, etc.), Sense of purpose, and Physical Health (exercise, healthy diet, medication compliance, etc.)  Goals Addressed: Patient will:  Reduce symptoms of: anxiety, depression, and stress   Increase knowledge and/or ability of: healthy habits, self-management skills, and stress reduction   Demonstrate ability to: Increase motivation to adhere to plan of care  Progress towards Goals: Ongoing    Interventions: Interventions utilized:  Motivational Interviewing and Supportive Reflection Standardized Assessments completed: Not Needed  Patient and/or Family Response: Patient agrees with treatment plan.  Clinical Assessment/Diagnosis  Generalized anxiety disorder  Cannabis use disorder  Visual hallucination  Psychosocial stressors   Patient may benefit from continued therapeutic intervention . Plan: Follow up with behavioral health clinician on : Two weeks Behavioral recommendations:  -Continue plan to attend upcoming [facility] assessment -Establish with PCP of choice; consider also discussing visual disturbances with eye doctor to rule out any eye condition -Continue keeping log of visual hallucinations and possible triggers noticed (poor sleep, cannabis/THC, stress, mold); consider reducing exposure to all as able -Consider using [facility] walk-in Urgent Care if needed prior to scheduled appointment Referral(s): Integrated [employer] (In Clinic) and [facility] (LME/Outside Clinic)  I discussed the assessment and treatment plan with the patient and/or parent/guardian. They were provided an opportunity to ask questions and all were answered. They agreed with the plan and demonstrated an understanding of the instructions.   They were advised to call back or seek an in-person evaluation if the symptoms worsen or if the condition fails to improve as anticipated.  Christina [name] C Christina Ramey, LCSW

## 2024-11-15 ENCOUNTER — Ambulatory Visit: Admitting: Clinical

## 2024-11-15 DIAGNOSIS — F411 Generalized anxiety disorder: Secondary | ICD-10-CM

## 2024-11-15 DIAGNOSIS — F1290 Cannabis use, unspecified, uncomplicated: Secondary | ICD-10-CM

## 2024-11-15 DIAGNOSIS — Z658 Other specified problems related to psychosocial circumstances: Secondary | ICD-10-CM

## 2024-11-15 DIAGNOSIS — R441 Visual hallucinations: Secondary | ICD-10-CM

## 2024-11-15 NOTE — Patient Instructions (Signed)
 Center for [facility] at [facility] [address] [place], [place] [address] [phone] (main office) [phone] (Adalina Dopson's office)

## 2024-11-15 NOTE — BH Specialist Note (Signed)
 Integrated Behavioral Health via Telemedicine Visit  11/15/2024 Christina [name] [id number]  Number of Integrated Behavioral Health Clinician visits: Additional Visit  Session Start time: 1417   Session End time: 1504  Total time in minutes: 47  Referring Provider: Earnie Pouch, [name] Patient/Family location: Home [facility] Provider location: [facility]  All persons participating in visit: [name] and [facility] Christina [name]   Types of Service: Individual psychotherapy and Video visit  I connected with Christina [name] and/or Christina [name] n/a via  Telephone or Video Enabled Telemedicine Application  (Video is Caregility application) and verified that I am speaking with the correct person using two identifiers. Discussed confidentiality: Yes   I discussed the limitations of telemedicine and the availability of in person appointments.  Discussed there is a possibility of technology failure and discussed alternative modes of communication if that failure occurs.  I discussed that engaging in this telemedicine visit, they consent to the provision of behavioral healthcare and the services will be billed under their insurance.  Patient and/or legal guardian expressed understanding and consented to Telemedicine visit: Yes   Presenting Concerns: Patient and/or family reports the following symptoms/concerns: Processing the contrast between time at her dad's place with family support, to time in mom's home with less support/more judgment, as well as difficulty finding mom-friendly friends as an adult.  Duration of problem: ongoing; Severity of problem: moderately severe  Patient and/or Family's Strengths/Protective Factors: Social connections, Concrete supports in place (healthy food, safe environments, etc.), Sense of purpose, and Physical Health (exercise, healthy diet, medication compliance, etc.)  Goals Addressed: Patient  will:  Reduce symptoms of: anxiety, depression, and stress   Increase knowledge and/or ability of: stress reduction   Demonstrate ability to: Increase healthy adjustment to current life circumstances and Increase motivation to adhere to plan of care  Progress towards Goals: Ongoing  Interventions: Interventions utilized:  Supportive Reflection Standardized Assessments completed: Not Needed  Patient and/or Family Response: Patient agrees with treatment plan.   Clinical Assessment/Diagnosis  Generalized anxiety disorder  Cannabis use disorder  Visual hallucination  Psychosocial stressors   Patient may benefit from continued therapeutic intervention today .  Plan: Follow up with behavioral health clinician on : Call Demetreus Lothamer at [phone], as needed. Behavioral recommendations:  -Continue plan to attend upcoming [facility] appointment -Continue plan to establish care with PCP of choice; re-visit eye doctor of choice to rule out any eye condition with visual disturbances -Continue keeping log of any new visual hallucinations/disturbances and possible triggers (stress, poor sleep, cannabis, mold exposure, etc.); continue reducing above exposures until cause is determined -Continue efforts to make connections with others, even in the midst of difficulty (remain open, yet cautious with healthy boundaries, as discussed) Referral(s): Integrated [employer] (In Clinic)  I discussed the assessment and treatment plan with the patient and/or parent/guardian. They were provided an opportunity to ask questions and all were answered. They agreed with the plan and demonstrated an understanding of the instructions.   They were advised to call back or seek an in-person evaluation if the symptoms worsen or if the condition fails to improve as anticipated.  Warren BROCKS Denitra Donaghey, LCSW     03/04/2023   11:19 AM 02/13/2023   10:00 AM 12/24/2022   11:40 AM 08/31/2019    8:30 AM  Depression  screen PHQ 2/9  Decreased Interest 1 0 3 2  Down, Depressed, Hopeless 0 0 1 3  PHQ - 2 Score 1  0 4 5  Altered sleeping 2  0 2  Tired, decreased energy 3  3 2   Change in appetite 0  0 0  Feeling bad or failure about yourself  0  0 1  Trouble concentrating 1  1 0  Moving slowly or fidgety/restless 0  0 0  Suicidal thoughts 0  0 0  PHQ-9 Score 7   8  10       Data saved with a previous flowsheet row definition      03/04/2023   11:20 AM 12/24/2022   11:41 AM 08/31/2019    8:30 AM  GAD 7 : Generalized Anxiety Score  Nervous, Anxious, on Edge 2 3 3   Control/stop worrying 3 3 3   Worry too much - different things 3 3 3   Trouble relaxing 2 2 2   Restless 1 0 0  Easily annoyed or irritable 1 0 1  Afraid - awful might happen 3 2 3   Total GAD 7 Score 15 13 15

## 2024-11-25 ENCOUNTER — Ambulatory Visit (INDEPENDENT_AMBULATORY_CARE_PROVIDER_SITE_OTHER): Admitting: Clinical

## 2024-11-25 DIAGNOSIS — F333 Major depressive disorder, recurrent, severe with psychotic symptoms: Secondary | ICD-10-CM

## 2024-11-25 DIAGNOSIS — F4310 Post-traumatic stress disorder, unspecified: Secondary | ICD-10-CM
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
## 2024-11-26 ENCOUNTER — Encounter (HOSPITAL_COMMUNITY): Payer: Self-pay

## 2025-01-15 ENCOUNTER — Encounter: Payer: Self-pay | Admitting: Family Medicine
